# Patient Record
Sex: Female | Born: 1995 | Race: White | Hispanic: No | Marital: Married | Smoking: Never smoker
Health system: Southern US, Community
[De-identification: ages and names within clinical notes are randomized; demographics above are authoritative.]

## PROBLEM LIST (undated history)

## (undated) ENCOUNTER — Inpatient Hospital Stay (HOSPITAL_COMMUNITY): Payer: Self-pay

## (undated) DIAGNOSIS — Z8679 Personal history of other diseases of the circulatory system: Secondary | ICD-10-CM

## (undated) HISTORY — PX: OTHER SURGICAL HISTORY: SHX169

---
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
## 2015-01-10 HISTORY — PX: OTHER SURGICAL HISTORY: SHX169

## 2015-01-10 HISTORY — PX: WISDOM TOOTH EXTRACTION: SHX21

## 2018-09-10 DIAGNOSIS — U071 COVID-19: Secondary | ICD-10-CM

## 2018-09-10 HISTORY — DX: COVID-19: U07.1

## 2019-06-20 ENCOUNTER — Telehealth: Payer: Self-pay

## 2019-06-20 NOTE — Telephone Encounter (Signed)
NOTES ON FILE FROM  THE [facility] [phone], SENT REFERRAL TO SCHEDULING

## 2019-07-03 ENCOUNTER — Other Ambulatory Visit: Payer: Self-pay | Admitting: *Deleted

## 2019-07-03 DIAGNOSIS — R000 Tachycardia, unspecified: Secondary | ICD-10-CM

## 2019-07-04 ENCOUNTER — Telehealth: Payer: Self-pay | Admitting: Radiology

## 2019-07-04 NOTE — Telephone Encounter (Signed)
Enrolled patient for a 14 day Zio monitor to be mailed to patients home.  

## 2019-07-09 ENCOUNTER — Telehealth: Payer: Self-pay | Admitting: *Deleted

## 2019-07-09 NOTE — Telephone Encounter (Signed)
Returned call to inform patient ok to hold off applying ZIO patch monitor until she returns home from beach trip.  Monitor not waterproof for swimming and may fall off with excessive perspiration.  No answer/ No voicemail.

## 2019-07-09 NOTE — Telephone Encounter (Signed)
New message   Patient wants to know if okay to delay wearing heart monitor. She is going to beach. Please advise.

## 2019-07-16 ENCOUNTER — Encounter: Payer: Self-pay | Admitting: Family Medicine

## 2019-07-18 ENCOUNTER — Other Ambulatory Visit (INDEPENDENT_AMBULATORY_CARE_PROVIDER_SITE_OTHER): Payer: BC Managed Care – PPO

## 2019-07-18 DIAGNOSIS — R000 Tachycardia, unspecified: Secondary | ICD-10-CM

## 2019-07-24 ENCOUNTER — Emergency Department (HOSPITAL_COMMUNITY): Payer: BC Managed Care – PPO

## 2019-07-24 ENCOUNTER — Emergency Department (HOSPITAL_COMMUNITY)
Admission: EM | Admit: 2019-07-24 | Discharge: 2019-07-25 | Disposition: A | Discharge status: Home / Self Care | Payer: BC Managed Care – PPO | Attending: Emergency Medicine | Admitting: Emergency Medicine

## 2019-07-24 ENCOUNTER — Encounter (HOSPITAL_COMMUNITY): Payer: Self-pay | Admitting: *Deleted

## 2019-07-24 ENCOUNTER — Telehealth: Payer: Self-pay | Admitting: Advanced Practice Midwife

## 2019-07-24 ENCOUNTER — Other Ambulatory Visit: Payer: Self-pay

## 2019-07-24 DIAGNOSIS — O26899 Other specified pregnancy related conditions, unspecified trimester: Secondary | ICD-10-CM | POA: Diagnosis not present

## 2019-07-24 DIAGNOSIS — N939 Abnormal uterine and vaginal bleeding, unspecified: Secondary | ICD-10-CM

## 2019-07-24 DIAGNOSIS — R102 Pelvic and perineal pain: Secondary | ICD-10-CM

## 2019-07-24 DIAGNOSIS — O26859 Spotting complicating pregnancy, unspecified trimester: Secondary | ICD-10-CM | POA: Insufficient documentation

## 2019-07-24 LAB — HCG, QUANTITATIVE, PREGNANCY: hCG, Beta Chain, Quant, S: <1 m[IU]/mL (ref <5)

## 2019-07-24 LAB — CBC WITH DIFFERENTIAL/PLATELET
Abs Immature Granulocytes: 0.03 10*3/uL (ref 0.00–0.07)
Basophils Absolute: 0 10*3/uL (ref 0.0–0.1)
Basophils Relative: 0 %
Eosinophils Absolute: 0.4 10*3/uL (ref 0.0–0.5)
Eosinophils Relative: 4 %
HCT: 39.9 % (ref 36.0–46.0)
Hemoglobin: 13 g/dL (ref 12.0–15.0)
Immature Granulocytes: 0 %
Lymphocytes Relative: 24 %
Lymphs Abs: 2.2 10*3/uL (ref 0.7–4.0)
MCH: 29 pg (ref 26.0–34.0)
MCHC: 32.6 g/dL (ref 30.0–36.0)
MCV: 89.1 fL (ref 80.0–100.0)
Monocytes Absolute: 0.6 10*3/uL (ref 0.1–1.0)
Monocytes Relative: 6 %
Neutro Abs: 6 10*3/uL (ref 1.7–7.7)
Neutrophils Relative %: 66 %
Platelets: 222 10*3/uL (ref 150–400)
RBC: 4.48 MIL/uL (ref 3.87–5.11)
RDW: 12.8 % (ref 11.5–15.5)
WBC: 9.3 10*3/uL (ref 4.0–10.5)
nRBC: 0 % (ref 0.0–0.2)

## 2019-07-24 LAB — URINALYSIS, ROUTINE W REFLEX MICROSCOPIC
Bilirubin Urine: NEGATIVE
Glucose, UA: NEGATIVE mg/dL
Ketones, ur: NEGATIVE mg/dL
Nitrite: NEGATIVE
Protein, ur: NEGATIVE mg/dL
RBC / HPF: >50 RBC/hpf — ABNORMAL HIGH (ref 0–5)
Specific Gravity, Urine: 1.01 (ref 1.005–1.030)
pH: 6 (ref 5.0–8.0)

## 2019-07-24 LAB — COMPREHENSIVE METABOLIC PANEL
ALT: 13 U/L (ref 0–44)
AST: 18 U/L (ref 15–41)
Albumin: 4 g/dL (ref 3.5–5.0)
Alkaline Phosphatase: 81 U/L (ref 38–126)
Anion gap: 9 (ref 5–15)
BUN: 9 mg/dL (ref 6–20)
CO2: 24 mmol/L (ref 22–32)
Calcium: 8.8 mg/dL — ABNORMAL LOW (ref 8.9–10.3)
Chloride: 104 mmol/L (ref 98–111)
Creatinine, Ser: 0.5 mg/dL (ref 0.44–1.00)
GFR calc Af Amer: >60 mL/min (ref >60)
GFR calc non Af Amer: >60 mL/min (ref >60)
Glucose, Bld: 94 mg/dL (ref 70–99)
Potassium: 3.5 mmol/L (ref 3.5–5.1)
Sodium: 137 mmol/L (ref 135–145)
Total Bilirubin: 0.7 mg/dL (ref 0.3–1.2)
Total Protein: 7.6 g/dL (ref 6.5–8.1)

## 2019-07-24 MED ORDER — ACETAMINOPHEN 325 MG PO TABS
650.0000 mg | ORAL_TABLET | Freq: Once | ORAL | Status: AC
  Administered 2019-07-24: 650 mg via ORAL
  Filled 2019-07-24: qty 2

## 2019-07-24 NOTE — Telephone Encounter (Signed)
This is the pt the new pt that I sent a message about. She said she is going to think about it . Is having severe cramping but the bleeding just started an hour ago , SHe just wanted to see if a nurse could call her back. I advised her that if she did decide to go to ER or anything got worse before we called her back to let go on to the ER and just call and let us know.

## 2019-07-24 NOTE — ED Triage Notes (Signed)
Had a positive pregnancy test  4 days ago, states she started bleeding today.states bleeding is heavier than regular period

## 2019-07-24 NOTE — Discharge Instructions (Addendum)
At this time there does not appear to be the presence of an emergent medical condition, however there is always the potential for conditions to change. Please read and follow the below instructions.  Please return to the Emergency Department immediately for any new or worsening symptoms. Please be sure to follow up with your Primary Care Provider within one week regarding your visit today; please call their office to schedule an appointment even if you are feeling better for a follow-up visit. Follow-up with your primary care provider and your OB/GYN for recheck.   Get help right away if: You pass out. You have to change pads every hour. You have belly (abdominal) pain. You have a fever. You get sweaty. You get weak. You passing large blood clots from your vagina. You have any new/concerning or worsening of symptoms  Please read the additional information packets attached to your discharge summary.  Do not take your medicine if  develop an itchy rash, swelling in your mouth or lips, or difficulty breathing; call 911 and seek immediate emergency medical attention if this occurs.  You may review your lab tests and imaging results in their entirety on your MyChart account.  Please discuss all results of fully with your primary care provider and other specialist at your follow-up visit.  Note: Portions of this text may have been transcribed using voice recognition software. Every effort was made to ensure accuracy; however, inadvertent computerized transcription errors may still be present.

## 2019-07-24 NOTE — ED Notes (Signed)
LMP June 13 th.  Pt reports having 3 positive pregnancy on Sunday.  Started today with abdominal cramping and vaginal bleeding with blood clots.  Rates pain 3/10.

## 2019-07-24 NOTE — ED Provider Notes (Signed)
[facility] EMERGENCY DEPARTMENT Provider Note   CSN: [id number] Arrival date & time: 07/24/19  1503     History Chief Complaint  Patient presents with  . Vaginal Bleeding    Brooke Sharp is a 24 y.o. female otherwise healthy noted medication use presents today for abdominal pain and vaginal bleeding.  Patient reports that she has been feeling well, she took 3 pregnancy tests on Sunday all of which showed positive.  She reports that today around 1:30 PM she began having low abdominal cramping sensation that she describes as constant nonradiating worse in the left lower quadrant, no clear aggravating or alleviating factors, no pain medication prior to arrival.  Associated with nausea without vomiting.  She reports that earlier time her cramping sensation started she began having vaginal bleeding as well.  She reports her last menstrual period was June 22, 2019.  Denies fever/chills, chest pain/shortness of breath, vomiting, diarrhea, dysuria/hematuria, concern for STI, vaginal discharge or any additional concerns.  Estimated gestational age [redacted] weeks and 5 days.  HPI     History reviewed. No pertinent past medical history.  There are no problems to display for this patient.   History reviewed. No pertinent surgical history.   OB History    Gravida  1   Para      Term      Preterm      AB      Living        SAB      TAB      Ectopic      Multiple      Live Births              No family history on file.  Social History   Tobacco Use  . Smoking status: Never Smoker  . Smokeless tobacco: Never Used  Substance Use Topics  . Alcohol use: Yes  . Drug use: Not Currently    Home Medications Prior to Admission medications   Not on File    Allergies    Meloxicam and Shellfish allergy  Review of Systems   Review of Systems Ten systems are reviewed and are negative for acute change except as noted in the HPI  Physical Exam Updated Vital Signs BP  128/77   Pulse 63   Temp 98.4 F (36.9 C)   Resp 18   Ht 5\' 4"  (1.626 m)   Wt 97.5 kg   LMP 06/21/2019   SpO2 99%   BMI 36.90 kg/m   Physical Exam Constitutional:      General: She is not in acute distress.    Appearance: Normal appearance. She is well-developed. She is not ill-appearing or diaphoretic.  HENT:     Head: Normocephalic and atraumatic.  Eyes:     General: Vision grossly intact. Gaze aligned appropriately.     Pupils: Pupils are equal, round, and reactive to light.  Neck:     Trachea: Trachea and phonation normal.  Pulmonary:     Effort: Pulmonary effort is normal. No respiratory distress.  Abdominal:     General: There is no distension.     Palpations: Abdomen is soft.     Tenderness: There is abdominal tenderness in the suprapubic area and left lower quadrant. There is no guarding or rebound.  Genitourinary:    Comments: Pelvic examination deferred by patient. Musculoskeletal:        General: Normal range of motion.     Cervical back: Normal range of motion.  Skin:    General: Skin is warm and dry.  Neurological:     Mental Status: She is alert.     GCS: GCS eye subscore is 4. GCS verbal subscore is 5. GCS motor subscore is 6.     Comments: Speech is clear and goal oriented, follows commands Major Cranial nerves without deficit, no facial droop Moves extremities without ataxia, coordination intact  Psychiatric:        Behavior: Behavior normal.     ED Results / Procedures / Treatments   Labs (all labs ordered are listed, but only abnormal results are displayed) Labs Reviewed  COMPREHENSIVE METABOLIC PANEL - Abnormal; Notable for the following components:      Result Value   Calcium 8.8 (*)    All other components within normal limits  URINALYSIS, ROUTINE W REFLEX MICROSCOPIC - Abnormal; Notable for the following components:   APPearance HAZY (*)    Hgb urine dipstick LARGE (*)    Leukocytes,Ua SMALL (*)    RBC / HPF >50 (*)    Bacteria, UA  RARE (*)    All other components within normal limits  URINE CULTURE  CBC WITH DIFFERENTIAL/PLATELET  HCG, QUANTITATIVE, PREGNANCY    EKG None  Radiology US PELVIC COMPLETE W TRANSVAGINAL AND TORSION R/O  Result Date: 07/24/2019 CLINICAL DATA:  Left pelvic pain EXAM: TRANSABDOMINAL AND TRANSVAGINAL ULTRASOUND OF PELVIS DOPPLER ULTRASOUND OF OVARIES TECHNIQUE: Both transabdominal and transvaginal ultrasound examinations of the pelvis were performed. Transabdominal technique was performed for global imaging of the pelvis including uterus, ovaries, adnexal regions, and pelvic cul-de-sac. It was necessary to proceed with endovaginal exam following the transabdominal exam to visualize the ovaries bilaterally. Color and duplex Doppler ultrasound was utilized to evaluate blood flow to the ovaries. COMPARISON:  None. FINDINGS: Uterus Measurements: 6.6 x 3.0 x 4.2 cm = volume: 44 mL. No fibroids or other mass visualized. Endometrium Thickness: Uniform, 6 mm.  No focal abnormality visualized. Right ovary Measurements: 3.1 x 2.1 x 2.1 cm = volume: 7 mL. Normal appearance/no adnexal mass. Several follicles noted. Left ovary Measurements: 1.6 x 1.8 x 2.5 cm = volume: 4 mL. Normal appearance/no adnexal mass. Several follicles are noted. Pulsed Doppler evaluation of both ovaries demonstrates normal low-resistance arterial and venous waveforms. Other findings No abnormal free fluid. IMPRESSION: Normal examination. Electronically Signed   By: Helyn Numbers MD   On: 07/24/2019 23:42    Procedures Procedures (including critical care time)  Medications Ordered in ED Medications  acetaminophen (TYLENOL) tablet 650 mg (650 mg Oral Given 07/24/19 2011)    ED Course  I have reviewed the triage vital signs and the nursing notes.  Pertinent labs & imaging results that were available during my care of the patient were reviewed by me and considered in my medical decision making (see chart for details).    MDM  Rules/Calculators/A&P                          Additional History Obtained: 1. Nursing notes from this visit.  24 year old female presents today for concern of lower abdominal pain and vaginal bleeding started today.  She reports that she had 3+ home pregnancy test on Sunday.  Last menstrual period was June 22, 2019, estimated gestational age of [redacted] weeks and 5 days.  Reports nausea is associated symptom.  No fever/chills, chest pain/shortness of breath, vomiting, diarrhea, dysuria/hematuria, concern for STI or any additional concerns.  Will obtain basic labs including  CBC, CMP, urinalysis, quantitative hCG.  Will obtain pelvic ultrasound to look for ectopic. - I ordered, reviewed and interpreted labs which include: CBC shows no leukocytosis to suggest infection and no evidence of anemia. Urinalysis shows leukocytes, WBCs, RBCs and bacteria, patient is menstruating suspect contaminated catch, urine culture sent no negation antibiotics at this point. CMP shows no emergent electrolyte derangement, evidence of acute kidney injury, elevation of LFTs or gap. Quantitative hCG is negative.  Given patient's amount of pain concern for possible torsion, ultrasound order changed to torsion rule out.  Discussed case with Dr. Deretha Emory who agrees. --------------- Pelvic US w/ torsion r/o:  IMPRESSION:  Normal examination.   Given home pregnancy tests were positive but a hCG quantitative here in the hospital being less than 1 unclear if this is a heavy menstrual cycle for the patient versus early miscarriage.  We will have patient follow-up with her PCP and OB/GYN.  On reexamination patient is well-appearing in no acute distress states understanding of care plan and is agreeable for discharge.  Of note patient deferred pelvic examination today, she denies any concern for STI, low suspicion for PID at this time, patient will follow up with outpatient providers and return to the ER for any new or worsening  symptoms.  Patient states understanding that evaluation for STI PID cannot be performed without pelvic examination.  Initially on reexamination abdomen is nontender, I doubt appendicitis, diverticulitis, SBO, perforation or other acute intra-abdominal/pelvic pathologies at this time.  At this time there does not appear to be any evidence of an acute emergency medical condition and the patient appears stable for discharge with appropriate outpatient follow up. Diagnosis was discussed with patient who verbalizes understanding of care plan and is agreeable to discharge. I have discussed return precautions with patient and husband who verbalizes understanding. Patient encouraged to follow-up with their PCP and OBGYN. All questions answered.  Patient's case discussed with Dr. Deretha Emory who agrees with plan to discharge with follow-up.   Note: Portions of this report may have been transcribed using voice recognition software. Every effort was made to ensure accuracy; however, inadvertent computerized transcription errors may still be present. Final Clinical Impression(s) / ED Diagnoses Final diagnoses:  Vaginal bleeding    Rx / DC Orders ED Discharge Orders    None       Elizabeth Palau 07/24/19 2354    Vanetta Mulders, MD 07/25/19 [phone]

## 2019-07-26 LAB — URINE CULTURE

## 2019-07-27 ENCOUNTER — Telehealth: Payer: Self-pay | Admitting: Emergency Medicine

## 2019-07-27 NOTE — Telephone Encounter (Signed)
Post ED Visit - Positive Culture Follow-up  Culture report reviewed by antimicrobial stewardship pharmacist: [facility] []  , Pharm.D. []  Enzo Bi, Pharm.D., BCPS AQ-ID []  , Pharm.D., BCPS []  Celedonio Miyamoto, Pharm.D., BCPS []  [place], Garvin Fila.D., BCPS, AAHIVP []  , Pharm.D., BCPS, AAHIVP []  Georgina Pillion, PharmD, BCPS []  , PharmD, BCPS []  [place], PharmD, BCPS [x]  [address], PharmD []  , PharmD, BCPS []  Estella Husk, PharmD  Pharmacy Team []  Lysle Pearl, PharmD []  , PharmD []  Phillips Climes, PharmD []  , Rph []  Agapito Games) , PharmD []  Joaquim Lai, PharmD []  , PharmD []  Mervyn Gay, PharmD []  , PharmD []  Vinnie Level, PharmD []  [facility], PharmD []  , PharmD []  Len Childs, PharmD   Positive urine culture No further patient follow-up is required at this time.  Maricel Swartzendruber 07/27/2019, 5:28 PM

## 2019-07-30 ENCOUNTER — Encounter: Payer: Self-pay | Admitting: Family Medicine

## 2019-09-12 ENCOUNTER — Other Ambulatory Visit: Payer: BC Managed Care – PPO | Admitting: Adult Health

## 2019-11-07 NOTE — Progress Notes (Deleted)
   Office Visit Note  Patient: Brooke Sharp             Date of Birth: [birth year]           MRN: [id number]             PCP: Tanna Furry, MD Referring: Tanna Furry,* Visit Date: 11/21/2019 Occupation: @GUAROCC @  Subjective:  No chief complaint on file.   History of Present Illness: Brooke Sharp is a 24 y.o. female ***   Activities of Daily Living:  Patient reports morning stiffness for *** {minute/hour:19697}.   Patient {ACTIONS;DENIES/REPORTS:21021675::"Denies"} nocturnal pain.  Difficulty dressing/grooming: {ACTIONS;DENIES/REPORTS:21021675::"Denies"} Difficulty climbing stairs: {ACTIONS;DENIES/REPORTS:21021675::"Denies"} Difficulty getting out of chair: {ACTIONS;DENIES/REPORTS:21021675::"Denies"} Difficulty using hands for taps, buttons, cutlery, and/or writing: {ACTIONS;DENIES/REPORTS:21021675::"Denies"}  No Rheumatology ROS completed.   PMFS History:  There are no problems to display for this patient.   No past medical history on file.  No family history on file. No past surgical history on file. Social History   Social History Narrative  . Not on file    There is no immunization history on file for this patient.   Objective: Vital Signs: LMP 06/21/2019    Physical Exam   Musculoskeletal Exam: ***  CDAI Exam: CDAI Score: -- Patient Global: --; Provider Global: -- Swollen: --; Tender: -- Joint Exam 11/21/2019   No joint exam has been documented for this visit   There is currently no information documented on the homunculus. Go to the Rheumatology activity and complete the homunculus joint exam.  Investigation: No additional findings.  Imaging: No results found.  Recent Labs: Lab Results  Component Value Date   WBC 9.3 07/24/2019   HGB 13.0 07/24/2019   PLT 222 07/24/2019   NA 137 07/24/2019   K 3.5 07/24/2019   CL 104 07/24/2019   CO2 24 07/24/2019   GLUCOSE 94 07/24/2019   BUN 9 07/24/2019   CREATININE 0.50  07/24/2019   BILITOT 0.7 07/24/2019   ALKPHOS 81 07/24/2019   AST 18 07/24/2019   ALT 13 07/24/2019   PROT 7.6 07/24/2019   ALBUMIN 4.0 07/24/2019   CALCIUM 8.8 (L) 07/24/2019   GFRAA >60 07/24/2019    Speciality Comments: No specialty comments available.  Procedures:  No procedures performed Allergies: Meloxicam and Shellfish allergy   Assessment / Plan:     Visit Diagnoses: Polyarthralgia - MCP/PIP and both ankle joints, morning stiffness 2hr+, actively trying to get pregnant  Family history of rheumatoid arthritis  History of esophageal reflux  Orders: No orders of the defined types were placed in this encounter.  No orders of the defined types were placed in this encounter.   Face-to-face time spent with patient was *** minutes. Greater than 50% of time was spent in counseling and coordination of care.  Follow-Up Instructions: No follow-ups on file.   07/26/2019, PA-C  Note - This record has been created using Dragon software.  Chart creation errors have been sought, but may not always  have been located. Such creation errors do not reflect on  the standard of medical care.

## 2019-11-21 ENCOUNTER — Ambulatory Visit: Payer: BC Managed Care – PPO | Admitting: Rheumatology

## 2019-11-21 DIAGNOSIS — Z8261 Family history of arthritis: Secondary | ICD-10-CM

## 2019-11-21 DIAGNOSIS — Z8719 Personal history of other diseases of the digestive system: Secondary | ICD-10-CM

## 2019-11-21 DIAGNOSIS — M2550 Pain in unspecified joint: Secondary | ICD-10-CM

## 2019-12-26 ENCOUNTER — Ambulatory Visit: Payer: BC Managed Care – PPO | Admitting: Rheumatology

## 2020-02-24 ENCOUNTER — Encounter (HOSPITAL_COMMUNITY): Payer: Self-pay | Admitting: Psychiatry

## 2020-02-24 ENCOUNTER — Other Ambulatory Visit: Payer: Self-pay

## 2020-02-24 ENCOUNTER — Ambulatory Visit (INDEPENDENT_AMBULATORY_CARE_PROVIDER_SITE_OTHER): Payer: Self-pay | Admitting: Psychiatry

## 2020-02-24 DIAGNOSIS — F4323 Adjustment disorder with mixed anxiety and depressed mood: Secondary | ICD-10-CM

## 2020-02-25 ENCOUNTER — Encounter (HOSPITAL_COMMUNITY): Payer: Self-pay | Admitting: Psychiatry

## 2020-02-25 DIAGNOSIS — F4323 Adjustment disorder with mixed anxiety and depressed mood: Secondary | ICD-10-CM | POA: Insufficient documentation

## 2020-02-25 HISTORY — DX: Adjustment disorder with mixed anxiety and depressed mood: F43.23
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
## 2020-06-24 ENCOUNTER — Other Ambulatory Visit: Payer: Self-pay

## 2020-06-24 ENCOUNTER — Encounter (HOSPITAL_BASED_OUTPATIENT_CLINIC_OR_DEPARTMENT_OTHER): Payer: Self-pay | Admitting: Obstetrics & Gynecology

## 2020-06-24 DIAGNOSIS — Z973 Presence of spectacles and contact lenses: Secondary | ICD-10-CM

## 2020-06-24 HISTORY — DX: Presence of spectacles and contact lenses: Z97.3

## 2020-06-24 NOTE — Progress Notes (Signed)
Spoke w/ via phone for pre-op interview---pt Lab needs dos----  none per anesthesia, surgery orders pending             Lab results------none COVID test -----patient states asymptomatic no test needed Arrive at ------- 530 am 06-29-2020  NO Solid Food.  Clear liquids from MN until---430 am then npo Med rec completed Medications to take morning of surgery -----none Diabetic medication -----n/a Patient instructed no nail polish to be worn day of surgery Patient instructed to bring photo id and insurance card day of surgery Patient aware to have Driver (ride ) / caregiver    spouse justin will stay for 24 hours after surgery  Patient Special Instructions -----none Pre-Op special Istructions -----none Patient verbalized understanding of instructions that were given at this phone interview. Patient denies shortness of breath, chest pain, fever, cough at this phone interview.

## 2020-06-25 NOTE — H&P (Signed)
Brooke Sharp is an 25 y.o. female G3P0030 with missed abortion diagnosed on 6/15 measuring [redacted]w[redacted]d.  Patient declined expectant and medical management.  She presents today for Suction D&E.    Pertinent Gynecological History: Menses: flow is moderate Bleeding: n/a Contraception: none DES exposure: unknown Blood transfusions: none Sexually transmitted diseases: no past history Previous GYN Procedures:  none   Last mammogram:  n/a  Date: n/a Last pap:  n/a  Date: n/a OB History: G3, P0   Menstrual History: Menarche age: n/a Patient's last menstrual period was 04/23/2020.    Past Medical History:  Diagnosis Date   Adjustment disorder with mixed anxiety and depressed mood 02/25/2020   COVID 09/2018   weakness, sob muscle cramps x 2 weeks all symptoms resolved   History of palpitations in adulthood    summer 2021, wore heart monitor from pcp dr Gabriel Rung [facility], issue resolved   Wears glasses 06/24/2020    Past Surgical History:  Procedure Laterality Date   left shoulder surgery Left 2017   labral tear   WISDOM TOOTH EXTRACTION  2017    Family History  Problem Relation Age of Onset   Depression Mother    Depression Paternal Grandmother    Drug abuse Paternal Grandmother     Social History:  reports that she has never smoked. She has never used smokeless tobacco. She reports current alcohol use. She reports previous drug use.  Allergies:  Allergies  Allergen Reactions   Shellfish Allergy Anaphylaxis   Latex Hives   Meloxicam Swelling    No medications prior to admission.    Review of Systems  Height 5\' 4"  (1.626 m), weight 96.2 kg, last menstrual period 04/23/2020, unknown if currently breastfeeding. Physical Exam Constitutional:      Appearance: Normal appearance.  HENT:     Head: Normocephalic and atraumatic.  Pulmonary:     Effort: Pulmonary effort is normal.  Abdominal:     Palpations: Abdomen is soft.  Musculoskeletal:         General: Normal range of motion.     Cervical back: Normal range of motion.  Skin:    General: Skin is warm and dry.  Neurological:     Mental Status: She is alert and oriented to person, place, and time.  Psychiatric:        Mood and Affect: Mood normal.        Behavior: Behavior normal.    No results found for this or any previous visit (from the past 24 hour(s)).  No results found.  Assessment/Plan: Missed abortion at 6 weeks Blood type pending; rhogam if RH negative. Plan Suction D&C.  Patient has been counseled re: risk of bleeding, infection, scarring and damage to surrounding structures.  She is informed of steps of the procedure itself as well as postop expectations.  Patient is offered genetic studies and is undecided.  Will readdress of day of surgery.  04/25/2020 06/25/2020, 8:21 AM

## 2020-06-29 ENCOUNTER — Ambulatory Visit (HOSPITAL_BASED_OUTPATIENT_CLINIC_OR_DEPARTMENT_OTHER): Payer: BC Managed Care – PPO | Admitting: Certified Registered"

## 2020-06-29 ENCOUNTER — Other Ambulatory Visit: Payer: Self-pay

## 2020-06-29 ENCOUNTER — Encounter (HOSPITAL_BASED_OUTPATIENT_CLINIC_OR_DEPARTMENT_OTHER)
Admission: RE | Disposition: A | Discharge status: Home / Self Care | Payer: Self-pay | Source: Home / Self Care | Attending: Obstetrics & Gynecology

## 2020-06-29 ENCOUNTER — Ambulatory Visit (HOSPITAL_COMMUNITY): Payer: BC Managed Care – PPO

## 2020-06-29 ENCOUNTER — Ambulatory Visit (HOSPITAL_BASED_OUTPATIENT_CLINIC_OR_DEPARTMENT_OTHER)
Admission: RE | Admit: 2020-06-29 | Discharge: 2020-06-29 | Disposition: A | Discharge status: Home / Self Care | Payer: BC Managed Care – PPO | Attending: Obstetrics & Gynecology | Admitting: Obstetrics & Gynecology

## 2020-06-29 ENCOUNTER — Encounter (HOSPITAL_BASED_OUTPATIENT_CLINIC_OR_DEPARTMENT_OTHER): Payer: Self-pay | Admitting: Obstetrics & Gynecology

## 2020-06-29 DIAGNOSIS — Z91040 Latex allergy status: Secondary | ICD-10-CM | POA: Insufficient documentation

## 2020-06-29 DIAGNOSIS — Z886 Allergy status to analgesic agent status: Secondary | ICD-10-CM | POA: Insufficient documentation

## 2020-06-29 DIAGNOSIS — Z91013 Allergy to seafood: Secondary | ICD-10-CM | POA: Diagnosis not present

## 2020-06-29 DIAGNOSIS — Z8616 Personal history of COVID-19: Secondary | ICD-10-CM | POA: Diagnosis not present

## 2020-06-29 DIAGNOSIS — O021 Missed abortion: Secondary | ICD-10-CM | POA: Diagnosis not present

## 2020-06-29 DIAGNOSIS — O039 Complete or unspecified spontaneous abortion without complication: Secondary | ICD-10-CM

## 2020-06-29 HISTORY — DX: Personal history of other diseases of the circulatory system: Z86.79

## 2020-06-29 LAB — CBC
HCT: 44.3 % (ref 36.0–46.0)
Hemoglobin: 14.6 g/dL (ref 12.0–15.0)
MCH: 29.1 pg (ref 26.0–34.0)
MCHC: 33 g/dL (ref 30.0–36.0)
MCV: 88.4 fL (ref 80.0–100.0)
Platelets: 245 10*3/uL (ref 150–400)
RBC: 5.01 MIL/uL (ref 3.87–5.11)
RDW: 12.6 % (ref 11.5–15.5)
WBC: 8.8 10*3/uL (ref 4.0–10.5)
nRBC: 0 % (ref 0.0–0.2)

## 2020-06-29 LAB — TYPE AND SCREEN
ABO/RH(D): A POS
Antibody Screen: NEGATIVE

## 2020-06-29 LAB — ABO/RH: ABO/RH(D): A POS

## 2020-06-29 SURGERY — DILATION AND EVACUATION, UTERUS
Anesthesia: Monitor Anesthesia Care

## 2020-06-29 MED ORDER — FENTANYL CITRATE (PF) 100 MCG/2ML IJ SOLN: 25.0000 ug | INTRAMUSCULAR | Status: DC | PRN

## 2020-06-29 MED ORDER — MISOPROSTOL 200 MCG PO TABS
ORAL_TABLET | ORAL | Status: AC
  Filled 2020-06-29: qty 4

## 2020-06-29 MED ORDER — PROMETHAZINE HCL 25 MG/ML IJ SOLN: 6.2500 mg | INTRAMUSCULAR | Status: DC | PRN

## 2020-06-29 MED ORDER — LIDOCAINE HCL 1 % IJ SOLN
INTRAMUSCULAR | Status: AC
  Filled 2020-06-29: qty 20

## 2020-06-29 MED ORDER — ONDANSETRON HCL 4 MG/2ML IJ SOLN
INTRAMUSCULAR | Status: AC
  Filled 2020-06-29: qty 2

## 2020-06-29 MED ORDER — PROPOFOL 10 MG/ML IV BOLUS
INTRAVENOUS | Status: AC
  Filled 2020-06-29: qty 40

## 2020-06-29 MED ORDER — LIDOCAINE HCL (PF) 2 % IJ SOLN
INTRAMUSCULAR | Status: AC
  Filled 2020-06-29: qty 5

## 2020-06-29 MED ORDER — LACTATED RINGERS IV SOLN: INTRAVENOUS | Status: DC

## 2020-06-29 MED ORDER — SODIUM CHLORIDE 0.9 % IV SOLN
100.0000 mg | Freq: Once | INTRAVENOUS | Status: DC
  Filled 2020-06-29: qty 100

## 2020-06-29 MED ORDER — POVIDONE-IODINE 10 % EX SWAB: 2.0000 "application " | Freq: Once | CUTANEOUS | Status: DC

## 2020-06-29 MED ORDER — ACETAMINOPHEN 500 MG PO TABS
ORAL_TABLET | ORAL | Status: AC
  Filled 2020-06-29: qty 2

## 2020-06-29 MED ORDER — DEXMEDETOMIDINE (PRECEDEX) IN NS 20 MCG/5ML (4 MCG/ML) IV SYRINGE
PREFILLED_SYRINGE | INTRAVENOUS | Status: AC
  Filled 2020-06-29: qty 5

## 2020-06-29 MED ORDER — SILVER NITRATE-POT NITRATE 75-25 % EX MISC
CUTANEOUS | Status: AC
  Filled 2020-06-29: qty 10

## 2020-06-29 MED ORDER — DEXAMETHASONE SODIUM PHOSPHATE 10 MG/ML IJ SOLN
INTRAMUSCULAR | Status: AC
  Filled 2020-06-29: qty 1

## 2020-06-29 MED ORDER — METHYLERGONOVINE MALEATE 0.2 MG/ML IJ SOLN
INTRAMUSCULAR | Status: AC
  Filled 2020-06-29: qty 1

## 2020-06-29 MED ORDER — FENTANYL CITRATE (PF) 100 MCG/2ML IJ SOLN
INTRAMUSCULAR | Status: AC
  Filled 2020-06-29: qty 2

## 2020-06-29 MED ORDER — ACETAMINOPHEN 500 MG PO TABS
1000.0000 mg | ORAL_TABLET | Freq: Once | ORAL | Status: AC
  Administered 2020-06-29: 1000 mg via ORAL

## 2020-06-29 MED ORDER — MIDAZOLAM HCL 2 MG/2ML IJ SOLN
INTRAMUSCULAR | Status: AC
  Filled 2020-06-29: qty 2

## 2020-06-29 SURGICAL SUPPLY — 16 items
CATH ROBINSON RED A/P 16FR (CATHETERS) ×1 IMPLANT
COVER WAND RF STERILE (DRAPES) ×1 IMPLANT
FILTER UTR ASPR ASSEMBLY (MISCELLANEOUS) ×1 IMPLANT
GLOVE SURG POLYISO LF SZ5.5 (GLOVE) ×2 IMPLANT
GLOVE SURG UNDER POLY LF SZ6 (GLOVE) ×1 IMPLANT
HOSE CONNECTING 18IN BERKELEY (TUBING) ×1 IMPLANT
KIT BERKELEY 1ST TRIMESTER 3/8 (MISCELLANEOUS) ×2 IMPLANT
KIT TURNOVER CYSTO (KITS) ×1 IMPLANT
PACK VAGINAL MINOR WOMEN LF (CUSTOM PROCEDURE TRAY) ×1 IMPLANT
SET BERKELEY SUCTION TUBING (SUCTIONS) ×1 IMPLANT
TOWEL OR 17X26 10 PK STRL BLUE (TOWEL DISPOSABLE) ×2 IMPLANT
VACURETTE 6 ASPIR F TIP BERK (CANNULA) IMPLANT
VACURETTE 7MM CVD STRL WRAP (CANNULA) IMPLANT
VACURETTE 8 RIGID CVD (CANNULA) IMPLANT
VACURETTE 9 RIGID CVD (CANNULA) IMPLANT
WATER STERILE IRR 500ML POUR (IV SOLUTION) ×1 IMPLANT

## 2020-06-29 NOTE — Progress Notes (Signed)
U/S shows no retained POC.  Will discharge patient.  Mitchel Honour, DO

## 2020-06-29 NOTE — Progress Notes (Signed)
Patient reports heavy bleeding and passing tissue yesterday.  Will do u/s to evaluate need for D&C.    Mitchel Honour, DO

## 2020-06-29 NOTE — Progress Notes (Signed)
IV removed after speaking to Dr. Langston Masker. Heart and grief information shared.  Dressed and ready to go home.

## 2020-06-29 NOTE — Anesthesia Preprocedure Evaluation (Deleted)
Anesthesia Evaluation  Patient identified by MRN, date of birth, ID band Patient awake    Reviewed: Allergy & Precautions, NPO status , Patient's Chart, lab work & pertinent test results  Airway Mallampati: II  TM Distance: >3 FB Neck ROM: Full    Dental  (+) Teeth Intact, Dental Advisory Given   Pulmonary neg pulmonary ROS,    Pulmonary exam normal breath sounds clear to auscultation       Cardiovascular negative cardio ROS Normal cardiovascular exam Rhythm:Regular Rate:Normal     Neuro/Psych PSYCHIATRIC DISORDERS Anxiety negative neurological ROS     GI/Hepatic negative GI ROS, Neg liver ROS,   Endo/Other  Obesity   Renal/GU negative Renal ROS     Musculoskeletal negative musculoskeletal ROS (+)   Abdominal   Peds  Hematology negative hematology ROS (+)   Anesthesia Other Findings Day of surgery medications reviewed with the patient.  Reproductive/Obstetrics Missed abortion                              Anesthesia Physical Anesthesia Plan  ASA: 2  Anesthesia Plan: MAC   Post-op Pain Management:    Induction: Intravenous  PONV Risk Score and Plan: 3 and Midazolam, TIVA, Propofol infusion and Ondansetron  Airway Management Planned: Nasal Cannula and Natural Airway  Additional Equipment:   Intra-op Plan:   Post-operative Plan:   Informed Consent: I have reviewed the patients History and Physical, chart, labs and discussed the procedure including the risks, benefits and alternatives for the proposed anesthesia with the patient or authorized representative who has indicated his/her understanding and acceptance.     Dental advisory given  Plan Discussed with: CRNA and Anesthesiologist  Anesthesia Plan Comments:         Anesthesia Quick Evaluation

## 2021-10-27 ENCOUNTER — Ambulatory Visit: Payer: Self-pay

## 2021-10-27 ENCOUNTER — Other Ambulatory Visit: Payer: Self-pay | Admitting: Nurse Practitioner

## 2021-10-27 DIAGNOSIS — M79644 Pain in right finger(s): Secondary | ICD-10-CM

## 2021-11-19 ENCOUNTER — Other Ambulatory Visit: Payer: Self-pay

## 2021-11-19 ENCOUNTER — Emergency Department (HOSPITAL_COMMUNITY): Payer: 59

## 2021-11-19 ENCOUNTER — Encounter (HOSPITAL_COMMUNITY): Payer: Self-pay | Admitting: Emergency Medicine

## 2021-11-19 ENCOUNTER — Emergency Department (HOSPITAL_COMMUNITY)
Admission: EM | Admit: 2021-11-19 | Discharge: 2021-11-19 | Disposition: A | Discharge status: Home / Self Care | Payer: 59 | Attending: Emergency Medicine | Admitting: Emergency Medicine

## 2021-11-19 DIAGNOSIS — H538 Other visual disturbances: Secondary | ICD-10-CM | POA: Insufficient documentation

## 2021-11-19 DIAGNOSIS — Z91040 Latex allergy status: Secondary | ICD-10-CM | POA: Insufficient documentation

## 2021-11-19 DIAGNOSIS — R7401 Elevation of levels of liver transaminase levels: Secondary | ICD-10-CM | POA: Insufficient documentation

## 2021-11-19 DIAGNOSIS — R059 Cough, unspecified: Secondary | ICD-10-CM | POA: Insufficient documentation

## 2021-11-19 DIAGNOSIS — R519 Headache, unspecified: Secondary | ICD-10-CM | POA: Insufficient documentation

## 2021-11-19 DIAGNOSIS — R200 Anesthesia of skin: Secondary | ICD-10-CM | POA: Insufficient documentation

## 2021-11-19 LAB — I-STAT CHEM 8, ED
BUN: 16 mg/dL (ref 6–20)
Calcium, Ion: 1.16 mmol/L (ref 1.15–1.40)
Chloride: 104 mmol/L (ref 98–111)
Creatinine, Ser: 0.6 mg/dL (ref 0.44–1.00)
Glucose, Bld: 106 mg/dL — ABNORMAL HIGH (ref 70–99)
HCT: 42 % (ref 36.0–46.0)
Hemoglobin: 14.3 g/dL (ref 12.0–15.0)
Potassium: 4.3 mmol/L (ref 3.5–5.1)
Sodium: 140 mmol/L (ref 135–145)
TCO2: 27 mmol/L (ref 22–32)

## 2021-11-19 LAB — CBC
HCT: 41.8 % (ref 36.0–46.0)
Hemoglobin: 13.7 g/dL (ref 12.0–15.0)
MCH: 29.3 pg (ref 26.0–34.0)
MCHC: 32.8 g/dL (ref 30.0–36.0)
MCV: 89.3 fL (ref 80.0–100.0)
Platelets: 246 10*3/uL (ref 150–400)
RBC: 4.68 MIL/uL (ref 3.87–5.11)
RDW: 12.4 % (ref 11.5–15.5)
WBC: 6.2 10*3/uL (ref 4.0–10.5)
nRBC: 0 % (ref 0.0–0.2)

## 2021-11-19 LAB — COMPREHENSIVE METABOLIC PANEL
ALT: 11 U/L (ref 0–44)
AST: 19 U/L (ref 15–41)
Albumin: 3.7 g/dL (ref 3.5–5.0)
Alkaline Phosphatase: 87 U/L (ref 38–126)
Anion gap: 11 (ref 5–15)
BUN: 12 mg/dL (ref 6–20)
CO2: 25 mmol/L (ref 22–32)
Calcium: 8.9 mg/dL (ref 8.9–10.3)
Chloride: 104 mmol/L (ref 98–111)
Creatinine, Ser: 0.69 mg/dL (ref 0.44–1.00)
GFR, Estimated: >60 mL/min (ref >60)
Glucose, Bld: 112 mg/dL — ABNORMAL HIGH (ref 70–99)
Potassium: 3.8 mmol/L (ref 3.5–5.1)
Sodium: 140 mmol/L (ref 135–145)
Total Bilirubin: 0.5 mg/dL (ref 0.3–1.2)
Total Protein: 6.9 g/dL (ref 6.5–8.1)

## 2021-11-19 LAB — I-STAT BETA HCG BLOOD, ED (MC, WL, AP ONLY): I-stat hCG, quantitative: <5 m[IU]/mL (ref <5)

## 2021-11-19 LAB — DIFFERENTIAL
Abs Immature Granulocytes: 0.02 10*3/uL (ref 0.00–0.07)
Basophils Absolute: 0.1 10*3/uL (ref 0.0–0.1)
Basophils Relative: 1 %
Eosinophils Absolute: 0.4 10*3/uL (ref 0.0–0.5)
Eosinophils Relative: 7 %
Immature Granulocytes: 0 %
Lymphocytes Relative: 31 %
Lymphs Abs: 1.9 10*3/uL (ref 0.7–4.0)
Monocytes Absolute: 0.3 10*3/uL (ref 0.1–1.0)
Monocytes Relative: 5 %
Neutro Abs: 3.4 10*3/uL (ref 1.7–7.7)
Neutrophils Relative %: 56 %

## 2021-11-19 LAB — ETHANOL: Alcohol, Ethyl (B): <10 mg/dL (ref <10)

## 2021-11-19 MED ORDER — SODIUM CHLORIDE 0.9 % IV SOLN: INTRAVENOUS | Status: DC

## 2021-11-19 MED ORDER — METOCLOPRAMIDE HCL 5 MG/ML IJ SOLN
10.0000 mg | Freq: Once | INTRAMUSCULAR | Status: AC
  Administered 2021-11-19: 10 mg via INTRAVENOUS
  Filled 2021-11-19: qty 2

## 2021-11-19 MED ORDER — ACETAMINOPHEN 500 MG PO TABS
1000.0000 mg | ORAL_TABLET | Freq: Once | ORAL | Status: AC
  Administered 2021-11-19: 1000 mg via ORAL
  Filled 2021-11-19: qty 2

## 2021-11-19 MED ORDER — IOHEXOL 350 MG/ML SOLN
75.0000 mL | Freq: Once | INTRAVENOUS | Status: AC | PRN
  Administered 2021-11-19: 75 mL via INTRAVENOUS

## 2021-11-19 MED ORDER — METOCLOPRAMIDE HCL 10 MG PO TABS: 10.0000 mg | ORAL_TABLET | Freq: Four times a day (QID) | ORAL | 0 refills | Status: DC

## 2021-11-19 MED ORDER — SODIUM CHLORIDE 0.9 % IV BOLUS
1000.0000 mL | Freq: Once | INTRAVENOUS | Status: AC
  Administered 2021-11-19: 1000 mL via INTRAVENOUS

## 2021-11-19 MED ORDER — DIPHENHYDRAMINE HCL 50 MG/ML IJ SOLN
12.5000 mg | Freq: Once | INTRAMUSCULAR | Status: AC
  Administered 2021-11-19: 12.5 mg via INTRAVENOUS
  Filled 2021-11-19: qty 1

## 2021-11-19 NOTE — ED Provider Notes (Signed)
[facility] EMERGENCY DEPARTMENT Provider Note   CSN: [id number] Arrival date & time: 11/19/21  8676     History  Chief Complaint  Patient presents with   Headache    Shenique Childers is a 26 y.o. female.   Headache    26 year old female with medical history significant for adjustment disorder with mixed anxiety and depressive mood, who presents to the emergency department with roughly 2 months of nearly constant daily headaches.  The patient states that she has noticed that it is been getting progressively worse.  She endorses a headache at the top of her head feeling like a pressure sensation.  It worsens with coughing.  She endorses intermittent blurred vision, denies any double vision.  She states that she is having numbness and tingling in her bilateral upper extremities from the elbows down.  She denies any weakness in her extremities.  She denies any facial droop.  She denies any fevers or chills.  She denies any neck rigidity.  She does endorse light sensitivity and sound sensitivity.  She endorses occasional nausea, no vomiting.  She states that she has been intermittently having her speech get jumbled and this has been worsening over the past few days.  Home Medications Prior to Admission medications   Medication Sig Start Date End Date Taking? Authorizing Provider  Prenatal Vit-Fe Fumarate-FA (MULTIVITAMIN-PRENATAL) 27-0.8 MG TABS tablet Take 1 tablet by mouth daily at 12 noon. With dhea    [provider]  VITAMIN D PO Take by mouth daily.    [provider]      Allergies    Shellfish allergy, Latex, and Meloxicam    Review of Systems   Review of Systems  Eyes:  Positive for visual disturbance.  Neurological:  Positive for headaches.  All other systems reviewed and are negative.   Physical Exam Updated Vital Signs BP 114/76   Pulse 79   Temp 98.4 F (36.9 C)   Resp 16   LMP 10/06/2021   SpO2 100%   Physical Exam Vitals and nursing note reviewed.  Constitutional:      General: She is not in acute distress.    Appearance: She is well-developed.  HENT:     Head: Normocephalic and atraumatic.  Eyes:     Conjunctiva/sclera: Conjunctivae normal.  Cardiovascular:     Rate and Rhythm: Normal rate and regular rhythm.  Pulmonary:     Effort: Pulmonary effort is normal. No respiratory distress.     Breath sounds: Normal breath sounds.  Abdominal:     Palpations: Abdomen is soft.     Tenderness: There is no abdominal tenderness.  Musculoskeletal:        General: No swelling.     Cervical back: Neck supple.  Skin:    General: Skin is warm and dry.     Capillary Refill: Capillary refill takes less than 2 seconds.  Neurological:     Mental Status: She is alert.     Comments: MENTAL STATUS EXAM:    Orientation: Alert and oriented to person, place and time.  Memory: Cooperative, follows commands well.  Language: Speech is clear and language is normal.   CRANIAL NERVES:    CN 2 (Optic): Visual fields intact to confrontation.  CN 3,4,6 (EOM): Pupils equal and reactive to light. Full extraocular eye movement without nystagmus.  CN 5 (Trigeminal): Facial sensation is normal, no weakness of masticatory muscles.  CN 7 (Facial): No facial weakness or asymmetry.  CN 8 (  Auditory): Auditory acuity grossly normal.  CN 9,10 (Glossophar): The uvula is midline, the palate elevates symmetrically.  CN 11 (spinal access): Normal sternocleidomastoid and trapezius strength.  CN 12 (Hypoglossal): The tongue is midline. No atrophy or fasciculations.Marland Kitchen   MOTOR:  Muscle Strength: 5/5RUE, 5/5LUE, 5/5RLE, 5/5LLE.   COORDINATION:   Intact finger-to-nose, no tremor, no pronator drift.   SENSATION:   Intact to light touch all four extremities.  GAIT: Gait normal without ataxia   Psychiatric:        Mood and Affect: Mood normal.     ED Results / Procedures / Treatments   Labs (all labs ordered are  listed, but only abnormal results are displayed) Labs Reviewed  COMPREHENSIVE METABOLIC PANEL - Abnormal; Notable for the following components:      Result Value   Glucose, Bld 112 (*)    All other components within normal limits  I-STAT CHEM 8, ED - Abnormal; Notable for the following components:   Glucose, Bld 106 (*)    All other components within normal limits  CBC  DIFFERENTIAL  ETHANOL  I-STAT BETA HCG BLOOD, ED ([facility], [facility], [facility] ONLY)    EKG EKG Interpretation  Date/Time:  Saturday November 19 2021 07:58:47 EST Ventricular Rate:  86 PR Interval:  138 QRS Duration: 80 QT Interval:  360 QTC Calculation: 430 R Axis:   55 Text Interpretation: Normal sinus rhythm Normal ECG No previous ECGs available Confirmed by Ernie Avena ([id number]) on 11/19/2021 2:24:06 PM  Radiology CT HEAD WO CONTRAST  Result Date: 11/19/2021 CLINICAL DATA:  26 year old female with 2 months of constant headaches, head pressure. Intermittent blurred vision and numbness. EXAM: CT HEAD WITHOUT CONTRAST TECHNIQUE: Contiguous axial images were obtained from the base of the skull through the vertex without intravenous contrast. RADIATION DOSE REDUCTION: This exam was performed according to the departmental dose-optimization program which includes automated exposure control, adjustment of the mA and/or kV according to patient size and/or use of iterative reconstruction technique. COMPARISON:  None Available. FINDINGS: Brain: Normal cerebral volume. No midline shift, ventriculomegaly, mass effect, evidence of mass lesion, intracranial hemorrhage or evidence of cortically based acute infarction. Gray-white matter differentiation is within normal limits throughout the brain. Vascular: No suspicious intracranial vascular hyperdensity. Skull: Negative. Sinuses/Orbits: Tympanic cavities and mastoids are clear. Visible paranasal sinuses are clear except for mild to moderate mucosal thickening in the left sphenoid. No complicating  features identified. Other: Visualized orbits and scalp soft tissues are within normal limits. IMPRESSION: 1. Normal noncontrast CT appearance of the brain. 2. Left sphenoid sinus inflammation, but no complicating features. Electronically Signed   By: Odessa Fleming M.D.   On: 11/19/2021 10:36    Procedures Procedures    Medications Ordered in ED Medications  sodium chloride 0.9 % bolus 1,000 mL (1,000 mLs Intravenous New Bag/Given 11/19/21 1343)    And  0.9 %  sodium chloride infusion (has no administration in time range)  metoCLOPramide (REGLAN) injection 10 mg (10 mg Intravenous Given 11/19/21 1352)  diphenhydrAMINE (BENADRYL) injection 12.5 mg (12.5 mg Intravenous Given 11/19/21 1348)  acetaminophen (TYLENOL) tablet 1,000 mg (1,000 mg Oral Given 11/19/21 1345)    ED Course/ Medical Decision Making/ A&P Clinical Course as of 11/19/21 1515  Sat Nov 19, 2021  1501 Assumed care from Dr Karene Fry. 26 yo F with 2 months of headache and non-specific symptoms. Non-contrast head CT WNL. Awaiting CTV at this time. Anticipate OP fu if CTV is WNL.  [RP]    Clinical Course User  Index [RP] Rondel BatonPaterson, Robert C, MD                           Medical Decision Making Amount and/or Complexity of Data Reviewed Labs: ordered. Radiology: ordered.  Risk OTC drugs. Prescription drug management.   26 year old female with medical history significant for adjustment disorder with mixed anxiety and depressive mood, who presents to the emergency department with roughly 2 months of nearly constant daily headaches.  The patient states that she has noticed that it is been getting progressively worse.  She endorses a headache at the top of her head feeling like a pressure sensation.  It worsens with coughing.  She endorses intermittent blurred vision, denies any double vision.  She states that she is having numbness and tingling in her bilateral upper extremities from the elbows down.  She denies any weakness in her  extremities.  She denies any facial droop.  She denies any fevers or chills.  She denies any neck rigidity.  She does endorse light sensitivity and sound sensitivity.  She endorses occasional nausea, no vomiting.  She states that she has been intermittently having her speech get jumbled and this has been worsening over the past few days.  Sherryl BartersHannah Elizabeth-Priddy Koffler is a 26 y.o. female who presents with two months of headache as per above. I have reviewed the nursing documentation for past medical history, family history, and social history. I have reviewed the EMR and have learned that she has no prior hx of migraines.  Currently, she is awake, alert, GCS 15, HDS, and afebrile. Her exam is most notable for normal gait, fully intact extraocular motions with bilaterally reactive pupils, no focal neurologic deficits, no meningismus, and no temporal tenderness. There is no rash. The headache was not sudden onset or the worst headache of the patient's life. There is no visual deficit.  I am most concerned for CVT vs chronic migraine headaches.  To further evaluate and risk stratify her, labs and imaging were obtained, which were significant for:  Labs: CMP unremarkable, hCG negative, ethanol negative, CBC without leukocytosis or anemia, platelets normal. Imaging:  CT Head: IMPRESSION:  1. Normal noncontrast CT appearance of the brain.  2. Left sphenoid sinus inflammation, but no complicating features.    Pt has no symptoms of bacterial sinusitis. Endorses some daily nasal congestion on awakening associated with seasonal allergies which clears rapidly. No fevers or chills, no purulent sinus drainage or sinus pain or pressure.   I do not think the patient has an aneurysm, intracranial bleed, mass lesion, meningitis, temporal arteritis, stroke, cluster headache, idiopathic intracranial hypertension, cavernous sinus thrombosis, carbon monoxide toxicity, herpes zoster, carotid or vertebral artery  dissection, or acute angle close glaucoma.  She denies a hx of thrombosis anywhere, and is not on estrogen containing OCPs, however, CVT remains on the differential therefore will obtain CT Venogram.  CT Venogram: Pending at time of signout.  On reassessment, the patient remained well appearing and was again able to ambulate without difficulty and did not have any focal neurologic deficits. Plan to follow-up CT, reassess the patient's symptoms, plan for likely DC if improved with outpatient neurology follow-up.    ED Medication Summary: Medications  sodium chloride 0.9 % bolus 1,000 mL (1,000 mLs Intravenous New Bag/Given 11/19/21 1343)    And  0.9 %  sodium chloride infusion (has no administration in time range)  metoCLOPramide (REGLAN) injection 10 mg (10 mg Intravenous Given 11/19/21 1352)  diphenhydrAMINE (BENADRYL) injection 12.5 mg (12.5 mg Intravenous Given 11/19/21 1348)  acetaminophen (TYLENOL) tablet 1,000 mg (1,000 mg Oral Given 11/19/21 1345)     Final Clinical Impression(s) / ED Diagnoses Final diagnoses:  Chronic nonintractable headache, unspecified headache type    Rx / DC Orders ED Discharge Orders     None         Ernie Avena, MD 11/19/21 1515

## 2021-11-19 NOTE — ED Provider Notes (Signed)
  Physical Exam  BP 111/61 (BP Location: Left Arm)   Pulse (!) 56   Temp 98.4 F (36.9 C)   Resp 14   LMP 10/06/2021   SpO2 99%   Physical Exam  Procedures  Procedures  ED Course / MDM   Clinical Course as of 11/19/21 1631  Sat Nov 19, 2021  1501 Assumed care from Dr Karene Fry. 26 yo F with 2 months of headache and non-specific symptoms. Non-contrast head CT WNL. Awaiting CTV at this time. Anticipate OP fu if CTV is WNL.  [RP]  1602 CT venogram did not reveal any acute abnormalities.  Patient reassessed and is feeling much better.  Says that her headache is minimal at this time.  We will have her follow-up with her primary doctor and neurology regarding her headache.  She was instructed to take Tylenol and ibuprofen along with Reglan as needed for headache.  Return precautions discussed prior to discharge. [RP]    Clinical Course User Index [RP] Rondel Baton, MD   Medical Decision Making Amount and/or Complexity of Data Reviewed Labs: ordered. Radiology: ordered.  Risk OTC drugs. Prescription drug management.     Rondel Baton, MD 11/19/21 [phone]

## 2021-11-19 NOTE — Discharge Instructions (Signed)
Today you were seen in the emergency department for your headache.    In the emergency department you had CT scans of your head that were reassuring.    At home, please take Tylenol and ibuprofen for headache along with the Reglan that we have prescribed you.    Check your MyChart online for the results of any tests that had not resulted by the time you left the emergency department.   Follow-up with your primary doctor in 2-3 days regarding your visit.  Follow-up with neurology as soon as possible about your headache.  Return immediately to the emergency department if you experience any of the following: Vision changes, slurred speech, facial droop, weakness of your arms or legs, or any other concerning symptoms.    Thank you for visiting our Emergency Department. It was a pleasure taking care of you today.

## 2021-11-19 NOTE — ED Notes (Signed)
Patient verbalizes understanding of discharge instructions. Opportunity for questioning and answers were provided. Armband removed by staff, pt discharged from ED.  

## 2021-11-19 NOTE — ED Triage Notes (Signed)
Patient here w/ constant headaches that have been happening for 2 months now, however this past week its gotten progressively worse. Patient describes the headache is occurring at the top of her head stating it feels mostly just like a pressure, when she coughs it does  get worse. She does report intermittent blurred vision, feeling as though her arms get numb and tingle, and feels that her words get jumbled intermittently and this has progressed over the last several days. Pt endorses nausea. Pt denies a hx of headaches/ migraines. Aox4.

## 2022-02-12 IMAGING — US US PELVIS COMPLETE TRANSABD/TRANSVAG W DUPLEX
1 series · 13 of 25 positions shown · non-contrast
Comparison: None.

CLINICAL DATA: Left pelvic pain

EXAM:
TRANSABDOMINAL AND TRANSVAGINAL ULTRASOUND OF PELVIS
DOPPLER ULTRASOUND OF OVARIES
TECHNIQUE: Both transabdominal and transvaginal ultrasound examinations of the
pelvis were performed. Transabdominal technique was performed for
global imaging of the pelvis including uterus, ovaries, adnexal
regions, and pelvic cul-de-sac.
It was necessary to proceed with endovaginal exam following the
transabdominal exam to visualize the ovaries bilaterally. Color and
duplex Doppler ultrasound was utilized to evaluate blood flow to the
ovaries.

[Series 1: us pelvis complete transabd/transvag w duplex · 0.22mm/px · 13 of 97 slices shown]
[im 1/97]
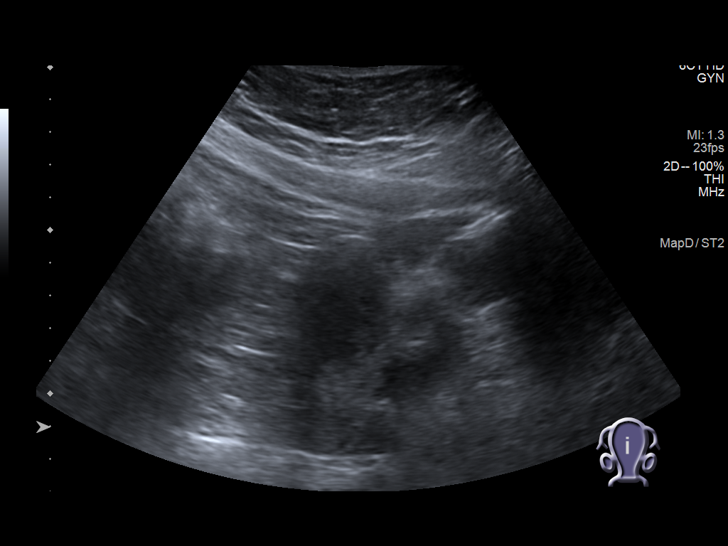
[im 9/97]
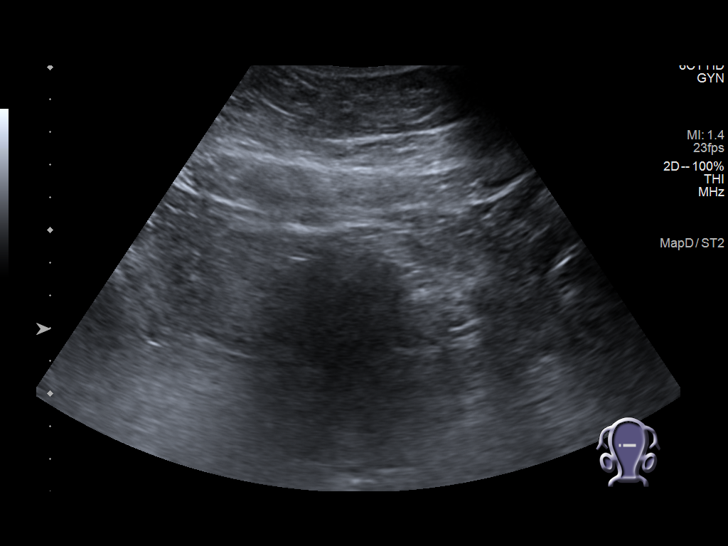
[im 17/97]
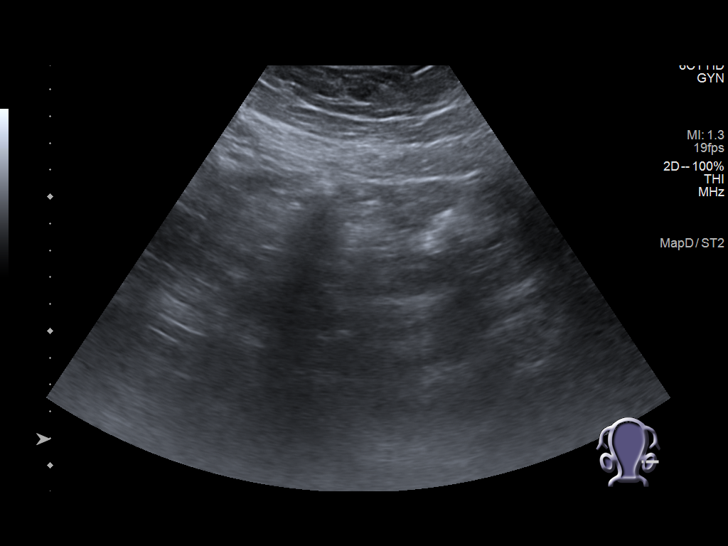
[im 25/97]
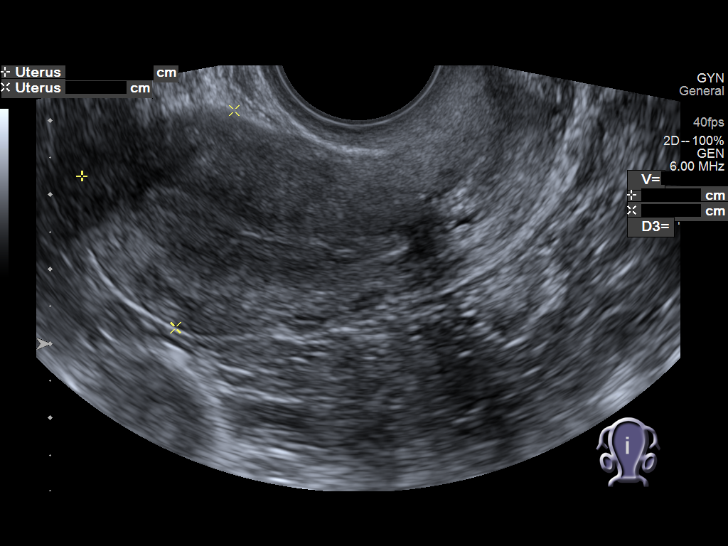
[im 33/97]
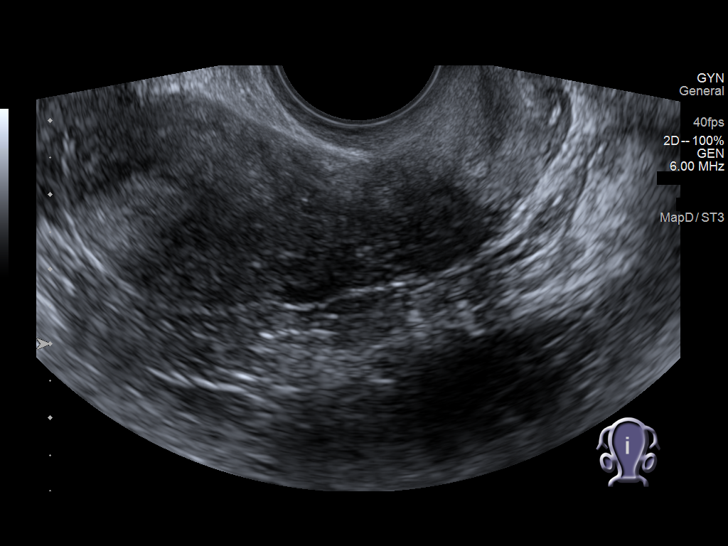
[im 41/97]
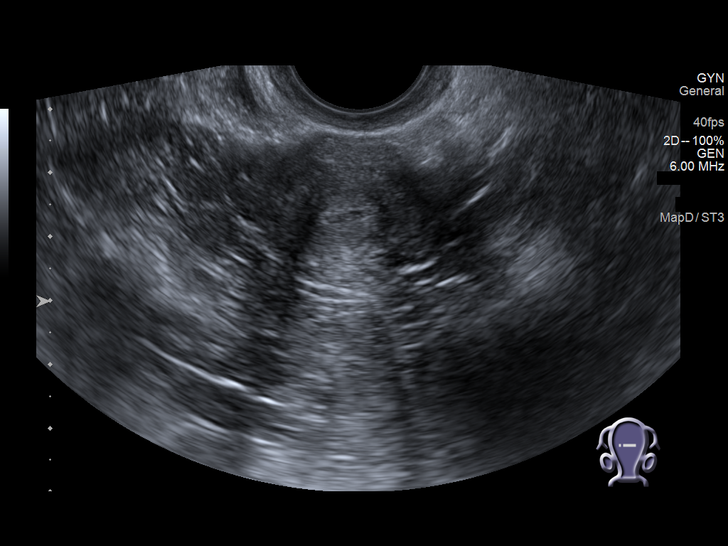
[im 49/97]
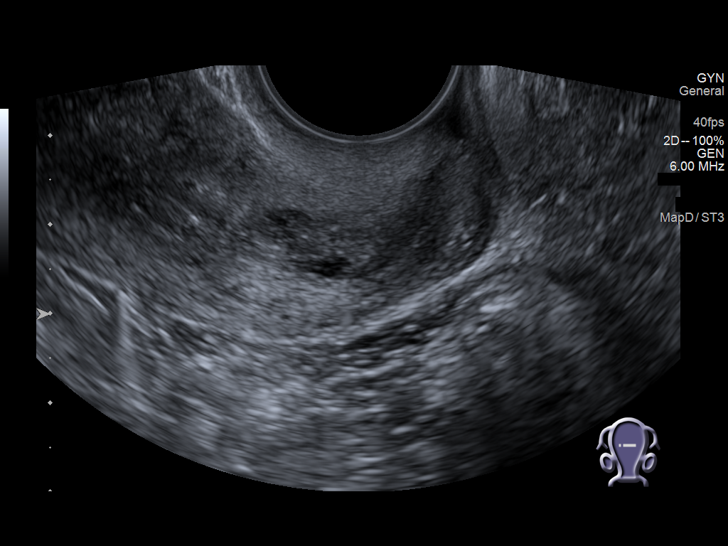
[im 57/97]
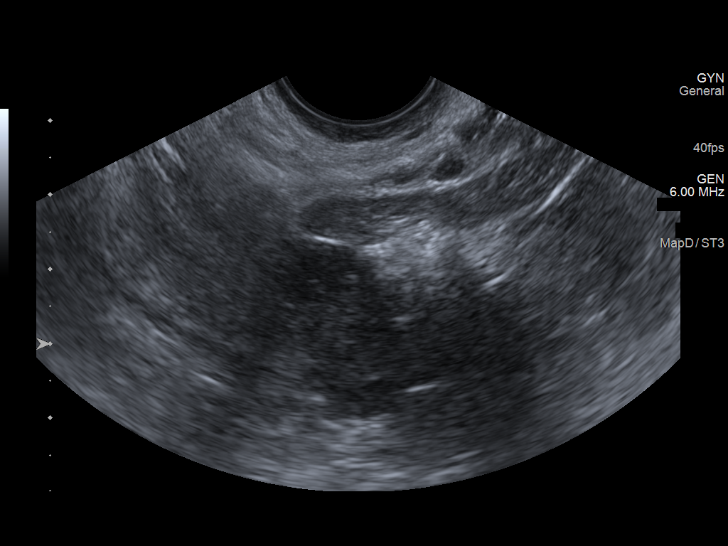
[im 65/97]
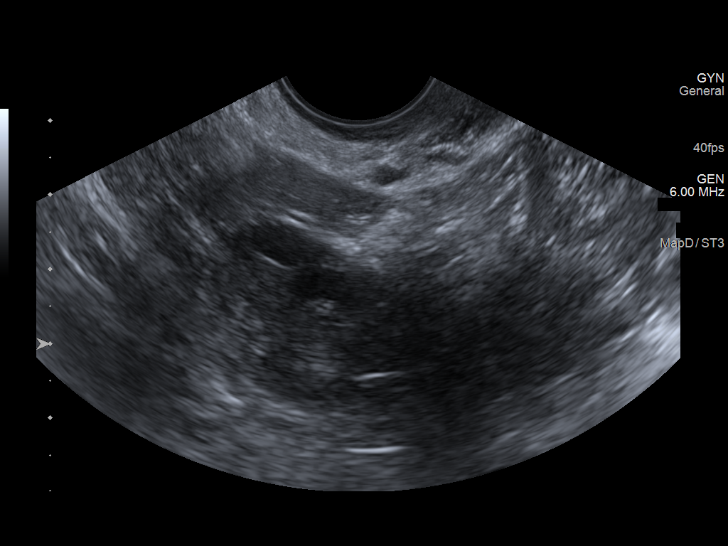
[im 73/97]
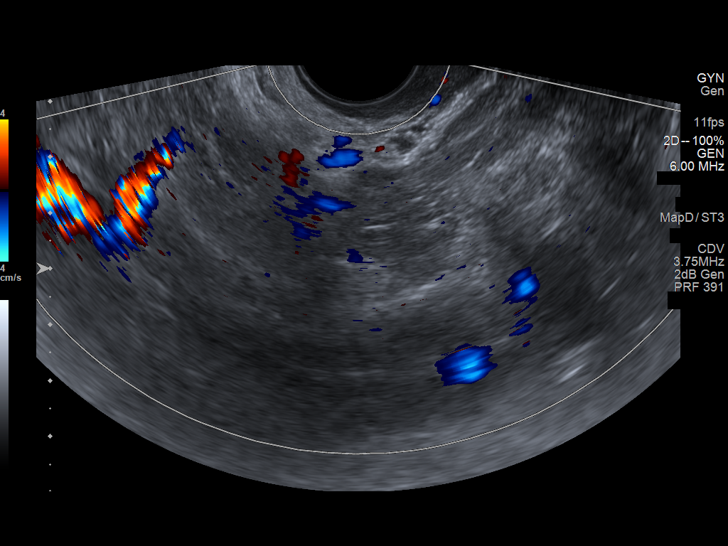
[im 81/97]
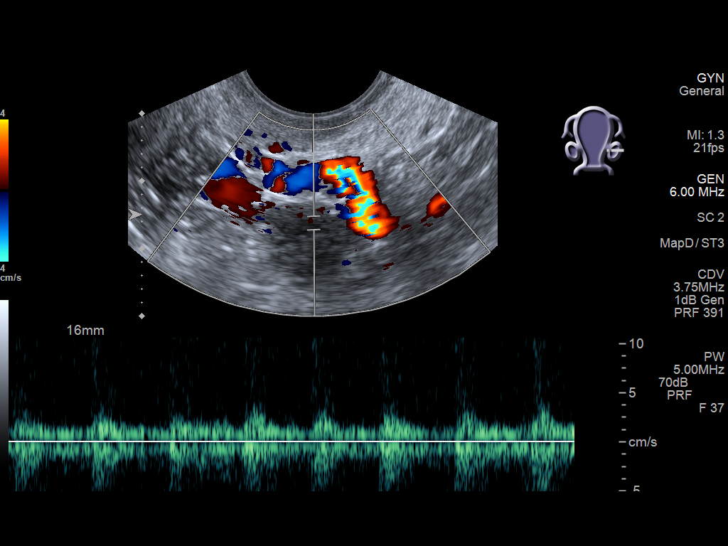
[im 89/97]
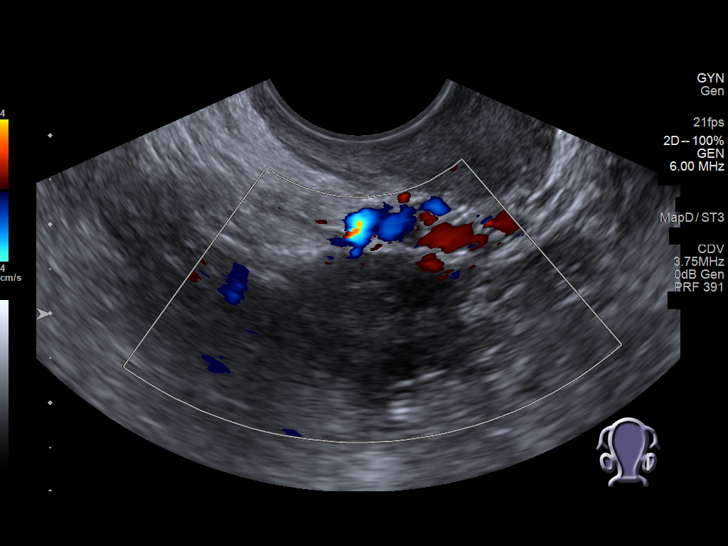
[im 97/97]
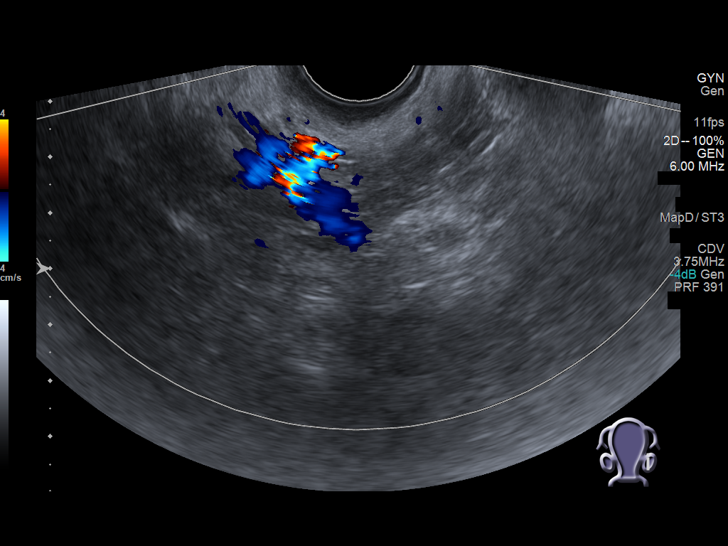

[13 of 25 positions shown; findings below may reference images not displayed]

FINDINGS: Uterus

Measurements: 6.6 x 3.0 x 4.2 cm = volume: 44 mL. No fibroids or
other mass visualized.

Endometrium

Thickness: Uniform, 6 mm.  No focal abnormality visualized.

Right ovary

Measurements: 3.1 x 2.1 x 2.1 cm = volume: 7 mL. Normal
appearance/no adnexal mass. Several follicles noted.

Left ovary

Measurements: 1.6 x 1.8 x 2.5 cm = volume: 4 mL. Normal
appearance/no adnexal mass. Several follicles are noted.

Pulsed Doppler evaluation of both ovaries demonstrates normal
low-resistance arterial and venous waveforms.

Other findings

No abnormal free fluid.
IMPRESSION: Normal examination.

## 2022-04-27 ENCOUNTER — Other Ambulatory Visit: Payer: Self-pay | Admitting: Family Medicine

## 2022-04-27 ENCOUNTER — Ambulatory Visit: Payer: Self-pay

## 2022-04-27 DIAGNOSIS — S93401A Sprain of unspecified ligament of right ankle, initial encounter: Secondary | ICD-10-CM

## 2022-05-17 DIAGNOSIS — N96 Recurrent pregnancy loss: Secondary | ICD-10-CM | POA: Diagnosis not present

## 2022-05-17 NOTE — Therapy (Signed)
OUTPATIENT PHYSICAL THERAPY LOWER EXTREMITY EVALUATION   Patient Name: Brooke Sharp MRN: [id number] DOB:Apr 21, 1995, 27 y.o., female Today's Date: 05/18/2022  END OF SESSION:  PT End of Session - 05/18/22 1348     Visit Number 1    Number of Visits 10    Date for PT Re-Evaluation 06/29/22    Authorization Type worker's comp, [place]    Authorization - Visit Number 1    Authorization - Number of Visits 8    PT Start Time 1400    PT Stop Time 1452    PT Time Calculation (min) 52 min    Activity Tolerance Patient tolerated treatment well;Patient limited by pain    Behavior During Therapy WFL for tasks assessed/performed             Past Medical History:  Diagnosis Date   Adjustment disorder with mixed anxiety and depressed mood 02/25/2020   COVID 09/2018   weakness, sob muscle cramps x 2 weeks all symptoms resolved   History of palpitations in adulthood    summer 2021, wore heart monitor from pcp dr Gabriel Rung [facility], issue resolved   Wears glasses 06/24/2020   Past Surgical History:  Procedure Laterality Date   left shoulder surgery Left 2017   labral tear   WISDOM TOOTH EXTRACTION  2017   Patient Active Problem List   Diagnosis Date Noted   Adjustment disorder with mixed anxiety and depressed mood 02/25/2020    PCP: No PCP in chart  REFERRING PROVIDER: Lanell Persons, MD  REFERRING DIAG: [phone] (ICD-10-CM) - Sprain of unspecified ligament of right ankle, subsequent encounter S93.601D (ICD-10-CM) - Unspecified sprain of right foot, subsequent encounter  THERAPY DIAG:  Pain in right ankle and joints of right foot  Stiffness of right ankle, not elsewhere classified  Other abnormalities of gait and mobility  Localized edema  Rationale for Evaluation and Treatment: Rehabilitation  ONSET DATE: End of March 2024  SUBJECTIVE:   SUBJECTIVE STATEMENT: Pt states in March she was carrying a linen bag that was fairly  heavy, tripped over it and rolled her ankle. States she initially thought pain would go away as she has rolled her ankles in the past, but it persisted into next day and so she went to see doctor. She states they gave her a surgical shoe, WBAT, was told that it was sprained. Encouraged to ice and rest. States she has had two follow up appts since. No longer wearing surgical shoe, now wearing ankle brace and remains WBAT. States lateral pain has gradually improved, but anterior and medial pain has improved "about 10%". States swelling has improved a great deal but does persist when she's up on her feet. Now icing PRN for swelling. No N/T. Tends to be pretty sore by end of work day  PERTINENT HISTORY: Anxiety/depression, palpitations with exercise  PAIN:  Are you having pain: 3/10 Location/description: lateral ankle, medial and anterior; variable pain tends to go from [employer] over past week: 3-6/10  - aggravating factors: driving, standing/walking >15 min, laundry (top loading, has to get on tip toes), uneven ground and hills  - Easing factors: rest, ice,     PRECAUTIONS: fall hx, palpitations with exercise (received work up)  WEIGHT BEARING RESTRICTIONS: No  FALLS:  Has patient fallen in last 6 months? Yes. 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  PATIENT SURVEYS:  FOTO 49 current, 64 predicted  COGNITION: Overall cognitive status: Within  functional limits for tasks assessed     SENSATION: Denies sensory complaints  EDEMA:  Mild swelling medial/anterior ankle around joint line, formal measurement deferred  PALPATION: Significant TTP anterior aspect of ankle, deltoid ligament, posterior to medial malleolus. Tenderness with palpation of distal gastroc/soleus with referral into medial ankle. No overt bony tenderness but is fairly tender circumferentially around medial malleolus   LOWER EXTREMITY ROM:  Active ROM Right eval Left eval  Knee flexion    Knee extension    Ankle dorsiflexion 8 deg mild pain * 12 deg  Ankle plantarflexion  45 deg  Ankle inversion 30 deg * 38 deg  Ankle eversion 12 deg* 15 deg   (Blank rows = not tested) Comments: concordant pain in anterior/dorsal foot with passive hallux extension, none with hallux flexion  LOWER EXTREMITY MMT:  MMT Right eval Left eval  Knee flexion    Knee extension    Ankle dorsiflexion  5  Ankle plantarflexion (modified sitting)  5  Ankle inversion  5  Ankle eversion  5   (Blank rows = not tested) Comments: RLE NT on eval given symptom irritability  FUNCTIONAL TESTS:  5xSTS: standard chair, no UE support  GAIT: Distance walked: within clinic  Assistive device utilized: no AD, ASO R LE Level of assistance: Complete Independence Comments: mild antalgic gait on RLE, reduced gait speed/cadence   TODAY'S TREATMENT:                                                                                                                              [facility] Adult PT Treatment:                                                DATE: 05/18/22 Therapeutic Exercise: Seated heel raise x10 RLE Toe scrunches x10 cues for tripod and reduced compensations HEP handout + education   PATIENT EDUCATION:  Education details: Pt education on PT impairments, prognosis, and POC. Informed consent. Rationale for interventions, safe/appropriate HEP performance Person educated:  Patient Education method: Explanation, Demonstration, Tactile cues, Verbal cues, and Handouts Education comprehension: verbalized understanding, returned demonstration, verbal cues required, tactile cues required, and needs further education    HOME EXERCISE PROGRAM: Access Code: [email or web address] URL: [email or web address] Date: 05/18/2022 Prepared by: Fransisco Hertz  Exercises - Seated Heel Raise  -  1 x daily - 7 x weekly - 2-3 sets - 10 reps - Towel Scrunches  - 1 x daily - 7 x weekly - 2-3 sets - 10 reps  ASSESSMENT:  CLINICAL IMPRESSION: Pt is a pleasant 27 year old woman who arrives to PT evaluation on this date for R ankle/foot pain after rolling her ankle at work in March 2023. Pt reports difficulty with prolonged standing, walking on even/uneven ground and inclines, and daily/work activities due to pain. During today's session pt demonstrates limitations in R ankle mobility in all planes (most concordant with inversion) which are limiting ability to perform aforementioned activities. MMT deferred given gradually increasing symptoms as exam goes on. Tolerates HEP well with education on appropriate home performance, no adverse events. Recommend skilled PT to address aforementioned deficits to improve functional independence/tolerance and progress back to work activities. Pt departs today's session in no acute distress, all voiced questions/concerns addressed appropriately from PT perspective.    OBJECTIVE IMPAIRMENTS: Abnormal gait, decreased activity tolerance, decreased balance, decreased endurance, decreased mobility, difficulty walking, decreased ROM, decreased strength, hypomobility, improper body mechanics, postural dysfunction, and pain.   ACTIVITY LIMITATIONS: carrying, lifting, standing, squatting, stairs, transfers, and locomotion level  PARTICIPATION LIMITATIONS: meal prep, cleaning, laundry, driving, community activity, and occupation   PERSONAL FACTORS: Time since  onset of injury/illness/exacerbation and 1 comorbidity: palpitations w/ exercise  are also affecting patient's functional outcome.   REHAB POTENTIAL: Good  CLINICAL DECISION MAKING: Evolving/moderate complexity  EVALUATION COMPLEXITY: Low   GOALS: Goals reviewed with patient? No  SHORT TERM GOALS: Target date: 06/08/2022 Pt will demonstrate appropriate understanding and performance of initially prescribed HEP in order to facilitate improved independence with management of symptoms.  Baseline: HEP provided on eval Goal status: INITIAL   2. Pt will score greater than or equal to 56 on FOTO in order to demonstrate improved perception of function due to symptoms.  Baseline: 49  Goal status: INITIAL    LONG TERM GOALS: Target date: 06/29/2022 Pt will score 64 or greater on FOTO in order to demonstrate improved perception of function due to symptoms.  Baseline: 49 Goal status: INITIAL  2.  Pt will demonstrate at least 12 degrees of ankle DF AROM in order to facilitate improved tolerance to functional movements such as squatting and walking up inclines. Baseline: see ROM chart above Goal status: INITIAL  3.  Pt will endorse ability to drive for up to 30 min with less than 2pt increase in pain on NPS in order to facilitate improved tolerance to community navigation.  Baseline: increased pain with minimal driving Goal status: INITIAL  4.  Pt will demonstrate ability to perform BIL heel raise and maintain for >15sec with less than 2 pt increase in pain in order to improve tolerance to laundry Baseline: top loading washer, pain/difficulty with loading due to heel raise per pt Goal status: INITIAL   5. Pt will demo/report ability to walk >45 min without seated rest, with less than 2pt increase on NPS in order to demo improved tolerance to recreational and work tasks.  Baseline: pain w/ >82min standing/walking  Goal status: INITIAL    PLAN:  PT FREQUENCY: 1-2x/week   PT DURATION: 6  weeks  PLANNED INTERVENTIONS: Therapeutic exercises, Therapeutic activity, Neuromuscular re-education, Balance training, Gait training, Patient/Family education, Self Care, Joint mobilization, Joint manipulation, Stair training, Aquatic Therapy, Dry Needling, Cryotherapy, Moist heat, Taping, Vasopneumatic device, Manual therapy, and Re-evaluation  PLAN FOR NEXT SESSION: review/update HEP PRN. Emphasis on foot intrinsic/extrinsic  training, comfortable ankle mobility, edema control    Ashley Murrain PT, DPT 05/18/2022 3:27 PM

## 2022-05-18 ENCOUNTER — Other Ambulatory Visit: Payer: Self-pay

## 2022-05-18 ENCOUNTER — Ambulatory Visit: Payer: PRIVATE HEALTH INSURANCE | Attending: Family Medicine | Admitting: Physical Therapy

## 2022-05-18 ENCOUNTER — Encounter: Payer: Self-pay | Admitting: Physical Therapy

## 2022-05-18 DIAGNOSIS — M25571 Pain in right ankle and joints of right foot: Secondary | ICD-10-CM | POA: Insufficient documentation

## 2022-05-18 DIAGNOSIS — R2689 Other abnormalities of gait and mobility: Secondary | ICD-10-CM | POA: Diagnosis present

## 2022-05-18 DIAGNOSIS — R600 Localized edema: Secondary | ICD-10-CM | POA: Diagnosis present

## 2022-05-18 DIAGNOSIS — M25671 Stiffness of right ankle, not elsewhere classified: Secondary | ICD-10-CM | POA: Insufficient documentation

## 2022-05-23 ENCOUNTER — Ambulatory Visit: Payer: PRIVATE HEALTH INSURANCE | Attending: Family Medicine | Admitting: Physical Therapy

## 2022-05-23 ENCOUNTER — Encounter: Payer: Self-pay | Admitting: Physical Therapy

## 2022-05-23 DIAGNOSIS — M25571 Pain in right ankle and joints of right foot: Secondary | ICD-10-CM | POA: Diagnosis present

## 2022-05-23 DIAGNOSIS — R2689 Other abnormalities of gait and mobility: Secondary | ICD-10-CM | POA: Insufficient documentation

## 2022-05-23 DIAGNOSIS — R600 Localized edema: Secondary | ICD-10-CM | POA: Diagnosis present

## 2022-05-23 DIAGNOSIS — M25671 Stiffness of right ankle, not elsewhere classified: Secondary | ICD-10-CM | POA: Insufficient documentation

## 2022-05-23 NOTE — Therapy (Signed)
OUTPATIENT PHYSICAL THERAPY TREATMENT NOTE    Patient Name: Brooke Sharp MRN: [id number] DOB:[birth year], 27 y.o., female Today's Date: 05/23/2022  END OF SESSION:  PT End of Session - 05/23/22 1452     Visit Number 2    Number of Visits 10    Date for PT Re-Evaluation 06/29/22    Authorization Type worker's comp, [place]    Authorization - Visit Number 2    Authorization - Number of Visits 8    PT Start Time 1453    PT Stop Time 1540    PT Time Calculation (min) 47 min    Activity Tolerance Patient tolerated treatment well;Patient limited by pain    Behavior During Therapy WFL for tasks assessed/performed              Past Medical History:  Diagnosis Date   Adjustment disorder with mixed anxiety and depressed mood 02/25/2020   COVID 09/2018   weakness, sob muscle cramps x 2 weeks all symptoms resolved   History of palpitations in adulthood    summer 2021, wore heart monitor from pcp dr Gabriel Rung [facility], issue resolved   Wears glasses 06/24/2020   Past Surgical History:  Procedure Laterality Date   left shoulder surgery Left 2017   labral tear   WISDOM TOOTH EXTRACTION  2017   Patient Active Problem List   Diagnosis Date Noted   Adjustment disorder with mixed anxiety and depressed mood 02/25/2020    PCP: No PCP in chart  REFERRING PROVIDER: Lanell Persons, MD  REFERRING DIAG: [phone] (ICD-10-CM) - Sprain of unspecified ligament of right ankle, subsequent encounter S93.601D (ICD-10-CM) - Unspecified sprain of right foot, subsequent encounter  THERAPY DIAG:  Pain in right ankle and joints of right foot  Stiffness of right ankle, not elsewhere classified  Other abnormalities of gait and mobility  Localized edema  Rationale for Evaluation and Treatment: Rehabilitation  ONSET DATE: End of March 2024  SUBJECTIVE:   SUBJECTIVE STATEMENT: 05/23/2022 Pt states she was a little sore initially after first session,  doing HEP twice a day. Still some soreness after HEP but only about 30 minutes and then returns to baseline. No other new updates.    Per eval - Pt states in March she was carrying a linen bag that was fairly heavy, tripped over it and rolled her ankle. States she initially thought pain would go away as she has rolled her ankles in the past, but it persisted into next day and so she went to see doctor. She states they gave her a surgical shoe, WBAT, was told that it was sprained. Encouraged to ice and rest. States she has had two follow up appts since. No longer wearing surgical shoe, now wearing ankle brace and remains WBAT. States lateral pain has gradually improved, but anterior and medial pain has improved "about 10%". States swelling has improved a great deal but does persist when she's up on her feet. Now icing PRN for swelling. No N/T. Tends to be pretty sore by end of work day  PERTINENT HISTORY: Anxiety/depression, palpitations with exercise  PAIN:  Are you having pain:  3/10 Location/description: lateral ankle, medial and anterior; variable pain tends to go from throbbing>stabbing   Per eval:  Best-worst over past week: 3-6/10  - aggravating factors: driving, standing/walking >15 min, laundry (top loading, has to get on tip toes), uneven ground and hills  - Easing factors: rest, ice,     PRECAUTIONS: fall hx, palpitations with  exercise (received work up)  WEIGHT BEARING RESTRICTIONS: No  FALLS:  Has patient fallen in last 6 months? Yes. 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  PATIENT SURVEYS:  FOTO 49 current, 64 predicted  COGNITION: Overall cognitive status: Within functional limits for tasks assessed     SENSATION: Denies sensory complaints  EDEMA:  Mild swelling medial/anterior ankle around joint line, formal measurement deferred  PALPATION: Significant TTP anterior aspect of ankle, deltoid ligament, posterior to medial malleolus. Tenderness with palpation of distal gastroc/soleus with referral into medial ankle. No overt bony tenderness but is fairly tender circumferentially around medial malleolus   LOWER EXTREMITY ROM:  Active ROM Right eval Left eval  Knee flexion    Knee extension    Ankle dorsiflexion 8 deg mild pain * 12 deg  Ankle plantarflexion  45 deg  Ankle inversion 30 deg * 38 deg  Ankle eversion 12 deg* 15 deg   (Blank rows = not tested) Comments: concordant pain in anterior/dorsal foot with passive hallux extension, none with hallux flexion  LOWER EXTREMITY MMT:  MMT Right eval Left eval  Knee flexion    Knee extension    Ankle dorsiflexion  5  Ankle plantarflexion (modified sitting)  5  Ankle inversion  5  Ankle eversion  5   (Blank rows = not tested) Comments: RLE NT on eval given symptom irritability  FUNCTIONAL TESTS:  5xSTS: standard chair, no UE support  GAIT: Distance walked: within clinic  Assistive device utilized: no AD, ASO R LE Level of assistance: Complete Independence Comments: mild antalgic gait on RLE, reduced gait speed/cadence   TODAY'S TREATMENT:                                                                                                                              [facility] Adult PT Treatment:                                                DATE: 05/23/22 Therapeutic Exercise: Toe scrunches 2x10 cues for  tripod Seated heel raise 2x10 RLE cues for setup AP rockerboard x8 cues for control  Inversion iso w ball 3x8 cues for foot positioning RTB eversion 2x8 cues for setup Manual Therapy: TC distraction  to pt tolerance, calcaneal distraction to pt tolerance STM to R anterior tib, gastroc soleus, post tib   [facility] Adult PT Treatment:                                                DATE: 05/18/22 Therapeutic Exercise: Seated heel raise x10 RLE Toe scrunches x10 cues for tripod and reduced compensations HEP handout + education   PATIENT EDUCATION:  Education details: rationale for interventions, relevant anatomy/physiology Person educated: Patient Education method: Explanation, Demonstration, Tactile cues, Verbal cues, and Handouts Education comprehension: verbalized understanding, returned demonstration, verbal cues required, tactile cues required, and needs further education    HOME EXERCISE PROGRAM: Access Code: [email or web address] URL: [email or web address] Date: 05/18/2022 Prepared by: Fransisco Hertz  Exercises - Seated Heel Raise  - 1 x daily - 7 x weekly - 2-3 sets - 10 reps - Towel Scrunches  - 1 x daily - 7 x weekly - 2-3 sets - 10 reps  ASSESSMENT:  CLINICAL IMPRESSION: 05/23/22 Pt arrives w/ 3/10 pain at present, reports good HEP adherence. Good performance w/ HEP review today, min cues. Able to tolerate progression for increased volume with foot intrinsic/extrinsic work although does endorse mild aggravation of symptoms as session goes on, peaking at 4.5/10 on NPS. Trial of manual post exercise with aim of symptom modification - concordant discomfort at R anterior tib and post tib that improves w/ STM, transient relief reported with TC distraction although pt notes no change in pain after manual. Departs with 4.5/10 pain, verbal HEP review. No adverse events. Pt departs today's session in no acute distress, all voiced questions/concerns addressed appropriately from PT  perspective.    Per eval - Pt is a pleasant 27 year old woman who arrives to PT evaluation on this date for R ankle/foot pain after rolling her ankle at work in March 2023. Pt reports difficulty with prolonged standing, walking on even/uneven ground and inclines, and daily/work activities due to pain. During today's session pt demonstrates limitations in R ankle mobility in all planes (most concordant with inversion) which are limiting ability to perform aforementioned activities. MMT deferred given gradually increasing symptoms as exam goes on. Tolerates HEP well with education on appropriate home performance, no adverse events. Recommend skilled PT to address aforementioned deficits to improve functional independence/tolerance and progress back to work activities. Pt departs today's session in no acute distress, all voiced questions/concerns addressed appropriately from PT perspective.    OBJECTIVE IMPAIRMENTS: Abnormal gait, decreased activity tolerance, decreased balance, decreased endurance, decreased mobility, difficulty walking, decreased ROM, decreased strength, hypomobility, improper body mechanics, postural dysfunction, and pain.   ACTIVITY LIMITATIONS: carrying, lifting, standing, squatting, stairs, transfers, and locomotion level  PARTICIPATION LIMITATIONS: meal prep, cleaning, laundry, driving, community activity, and occupation   PERSONAL FACTORS: Time since onset of injury/illness/exacerbation and 1 comorbidity: palpitations w/ exercise  are also affecting patient's functional outcome.   REHAB POTENTIAL: Good  CLINICAL DECISION MAKING: Evolving/moderate complexity  EVALUATION COMPLEXITY: Low   GOALS: Goals reviewed with patient? No  SHORT TERM GOALS: Target date: 06/08/2022 Pt will demonstrate appropriate understanding and performance of initially prescribed HEP in order to facilitate improved independence with management of symptoms.  Baseline: HEP provided on eval Goal  status: INITIAL   2. Pt will score greater than or equal to 56 on FOTO in order to demonstrate improved perception of function  due to symptoms.  Baseline: 49  Goal status: INITIAL    LONG TERM GOALS: Target date: 06/29/2022 Pt will score 64 or greater on FOTO in order to demonstrate improved perception of function due to symptoms.  Baseline: 49 Goal status: INITIAL  2.  Pt will demonstrate at least 12 degrees of ankle DF AROM in order to facilitate improved tolerance to functional movements such as squatting and walking up inclines. Baseline: see ROM chart above Goal status: INITIAL  3.  Pt will endorse ability to drive for up to 30 min with less than 2pt increase in pain on NPS in order to facilitate improved tolerance to community navigation.  Baseline: increased pain with minimal driving Goal status: INITIAL  4.  Pt will demonstrate ability to perform BIL heel raise and maintain for >15sec with less than 2 pt increase in pain in order to improve tolerance to laundry Baseline: top loading washer, pain/difficulty with loading due to heel raise per pt Goal status: INITIAL   5. Pt will demo/report ability to walk >45 min without seated rest, with less than 2pt increase on NPS in order to demo improved tolerance to recreational and work tasks.  Baseline: pain w/ >51min standing/walking  Goal status: INITIAL    PLAN:  PT FREQUENCY: 1-2x/week   PT DURATION: 6 weeks  PLANNED INTERVENTIONS: Therapeutic exercises, Therapeutic activity, Neuromuscular re-education, Balance training, Gait training, Patient/Family education, Self Care, Joint mobilization, Joint manipulation, Stair training, Aquatic Therapy, Dry Needling, Cryotherapy, Moist heat, Taping, Vasopneumatic device, Manual therapy, and Re-evaluation  PLAN FOR NEXT SESSION: review/update HEP PRN. Emphasis on foot intrinsic/extrinsic training, comfortable ankle mobility, edema control    Ashley Murrain PT, DPT 05/23/2022 5:18 PM

## 2022-05-24 DIAGNOSIS — Z3189 Encounter for other procreative management: Secondary | ICD-10-CM | POA: Diagnosis not present

## 2022-05-30 NOTE — Therapy (Signed)
OUTPATIENT PHYSICAL THERAPY TREATMENT NOTE    Patient Name: Brooke Sharp MRN: [id number] DOB:November 18, 1995, 27 y.o., female Today's Date: 05/31/2022  END OF SESSION:  PT End of Session - 05/31/22 1630     Visit Number 3    Number of Visits 10    Date for PT Re-Evaluation 06/29/22    Authorization Type worker's comp, [place]    Authorization - Visit Number 3    Authorization - Number of Visits 8    PT Start Time 1630    PT Stop Time 1717    PT Time Calculation (min) 47 min    Activity Tolerance Patient tolerated treatment well;Patient limited by pain    Behavior During Therapy WFL for tasks assessed/performed               Past Medical History:  Diagnosis Date   Adjustment disorder with mixed anxiety and depressed mood 02/25/2020   COVID 09/2018   weakness, sob muscle cramps x 2 weeks all symptoms resolved   History of palpitations in adulthood    summer 2021, wore heart monitor from pcp dr Gabriel Rung [facility], issue resolved   Wears glasses 06/24/2020   Past Surgical History:  Procedure Laterality Date   left shoulder surgery Left 2017   labral tear   WISDOM TOOTH EXTRACTION  2017   Patient Active Problem List   Diagnosis Date Noted   Adjustment disorder with mixed anxiety and depressed mood 02/25/2020    PCP: No PCP in chart  REFERRING PROVIDER: Lanell Persons, MD  REFERRING DIAG: [phone] (ICD-10-CM) - Sprain of unspecified ligament of right ankle, subsequent encounter S93.601D (ICD-10-CM) - Unspecified sprain of right foot, subsequent encounter  THERAPY DIAG:  Pain in right ankle and joints of right foot  Stiffness of right ankle, not elsewhere classified  Other abnormalities of gait and mobility  Localized edema  Rationale for Evaluation and Treatment: Rehabilitation  ONSET DATE: End of March 2024  SUBJECTIVE:   SUBJECTIVE STATEMENT: 05/31/2022 Pt at 2/10 pain at present. Felt pretty sore after last  session for about a day and then back to baseline, pt notes she was less sore than expected. Reports good adherence to HEP, feels exercises are getting less irritating.   Per eval - Pt states in March she was carrying a linen bag that was fairly heavy, tripped over it and rolled her ankle. States she initially thought pain would go away as she has rolled her ankles in the past, but it persisted into next day and so she went to see doctor. She states they gave her a surgical shoe, WBAT, was told that it was sprained. Encouraged to ice and rest. States she has had two follow up appts since. No longer wearing surgical shoe, now wearing ankle brace and remains WBAT. States lateral pain has gradually improved, but anterior and medial pain has improved "about 10%". States swelling has improved a great deal but does persist when she's up on her feet. Now icing PRN for swelling. No N/T. Tends to be pretty sore by end of work day  PERTINENT HISTORY: Anxiety/depression, palpitations with exercise  PAIN:  Are you having pain:  2/10 Location/description: lateral ankle, medial and anterior; variable pain tends to go from throbbing>stabbing   Per eval:  Best-worst over past week: 3-6/10  - aggravating factors: driving, standing/walking >15 min, laundry (top loading, has to get on tip toes), uneven ground and hills  - Easing factors: rest, ice,  PRECAUTIONS: fall hx, palpitations with exercise (received work up)  WEIGHT BEARING RESTRICTIONS: No  FALLS:  Has patient fallen in last 6 months? Yes. 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  PATIENT SURVEYS:  FOTO 49 current, 64 predicted  COGNITION: Overall cognitive status: Within functional limits for tasks assessed     SENSATION: Denies sensory complaints  EDEMA:  Mild swelling medial/anterior ankle around joint line, formal measurement deferred  PALPATION: Significant TTP anterior aspect of ankle, deltoid ligament, posterior to medial malleolus. Tenderness with palpation of distal gastroc/soleus with referral into medial ankle. No overt bony tenderness but is fairly tender circumferentially around medial malleolus   LOWER EXTREMITY ROM:  Active ROM Right eval Left eval  Knee flexion    Knee extension    Ankle dorsiflexion 8 deg mild pain * 12 deg  Ankle plantarflexion  45 deg  Ankle inversion 30 deg * 38 deg  Ankle eversion 12 deg* 15 deg   (Blank rows = not tested) Comments: concordant pain in anterior/dorsal foot with passive hallux extension, none with hallux flexion  LOWER EXTREMITY MMT:  MMT Right eval Left eval  Knee flexion    Knee extension    Ankle dorsiflexion  5  Ankle plantarflexion (modified sitting)  5  Ankle inversion  5  Ankle eversion  5   (Blank rows = not tested) Comments: RLE NT on eval given symptom irritability  FUNCTIONAL TESTS:  5xSTS: standard chair, no UE support  GAIT: Distance walked: within clinic  Assistive device utilized: no AD, ASO R LE Level of assistance: Complete Independence Comments: mild antalgic gait on RLE, reduced gait speed/cadence   TODAY'S TREATMENT:                                                                                                                              [facility] Adult PT Treatment:                                                DATE: 05/31/22 Therapeutic Exercise: Seated  heel/toe raises 2x8  Seated toe scrunches x12 NWB, 2x10 PWB with approx through thigh  Seated toe extension 2x12 seated cues for tripod Airex lateral weight shift RLE progressing into step ups x8  CGA for safety Inversion iso w ball 3x8 Seated gastroc stretch w strap 3x30sec Seated soleus stretch w strap 3x30sec Closed chain soleus stretch w and w/o MWM 1 each HEP update + education   [facility] Adult PT Treatment:                                                DATE: 05/23/22 Therapeutic Exercise: Toe scrunches 2x10 cues for tripod Seated heel raise 2x10 RLE cues for setup AP rockerboard x8 cues for control  Inversion iso w ball 3x8 cues for foot positioning RTB eversion 2x8 cues for setup Manual Therapy: TC distraction to pt tolerance, calcaneal distraction to pt tolerance STM to R anterior tib, gastroc soleus, post tib   [facility] Adult PT Treatment:                                                DATE: 05/18/22 Therapeutic Exercise: Seated heel raise x10 RLE Toe scrunches x10 cues for tripod and reduced compensations HEP handout + education   PATIENT EDUCATION:  Education details: rationale for interventions, relevant anatomy/physiology Person educated: Patient Education method: Explanation, Demonstration, Tactile cues, Verbal cues, and Handouts Education comprehension: verbalized understanding, returned demonstration, verbal cues required, tactile cues required, and needs further education    HOME EXERCISE PROGRAM: Access Code: [email or web address] URL: [email or web address] Date: 05/31/2022 Prepared by: Fransisco Hertz  Program Notes - put weight through thigh for increased weightbearing with towel scrunches   Exercises - Towel Scrunches  - 1 x daily - 7 x weekly - 2-3 sets - 10 reps - Seated Heel Toe Raises  - 1 x daily - 7 x weekly - 2-3 sets - 8 reps - Seated Great Toe Extension  - 1 x daily - 7 x weekly - 2-3 sets - 12 reps  ASSESSMENT:  CLINICAL IMPRESSION: 05/31/22 Pt  arrives w/ 2/10 pain, notes that HEP is getting less irritating. Had about a day of soreness after last session. Today progressing volume and increased WB with foot intrinsic/extrinsic and ankle work today, pt reports gradual increase in pain/soreness as session goes on. Notes that open chain ankle DF stretching elicits anterior joint line discomfort - trial of MWM which irritates symptoms further, pt notes resolution of anterior joint pain with closed chain soleus stretching (limited by stretching sensation posteriorly). Pt politely declines manual on this date with aim of assessing effect on post session symptoms. Departs with 5/10 pain on NPS, no adverse events, most difficult activity today is airex step ups. Recommend continuing along current POC in order to address relevant deficits and improve functional tolerance. Pt departs today's session in no acute distress, all voiced questions/concerns addressed appropriately from PT perspective.    Per eval - Pt is a pleasant 27 year old woman who arrives to PT evaluation on this date for R ankle/foot pain after rolling her ankle at work in March 2023. Pt reports difficulty with prolonged standing, walking on even/uneven ground and inclines, and daily/work activities due to pain. During today's session pt demonstrates limitations in R ankle mobility in all planes (most concordant with inversion) which are limiting ability to perform aforementioned activities. MMT deferred given gradually increasing symptoms as exam goes on. Tolerates HEP well with  education on appropriate home performance, no adverse events. Recommend skilled PT to address aforementioned deficits to improve functional independence/tolerance and progress back to work activities. Pt departs today's session in no acute distress, all voiced questions/concerns addressed appropriately from PT perspective.    OBJECTIVE IMPAIRMENTS: Abnormal gait, decreased activity tolerance, decreased balance, decreased  endurance, decreased mobility, difficulty walking, decreased ROM, decreased strength, hypomobility, improper body mechanics, postural dysfunction, and pain.   ACTIVITY LIMITATIONS: carrying, lifting, standing, squatting, stairs, transfers, and locomotion level  PARTICIPATION LIMITATIONS: meal prep, cleaning, laundry, driving, community activity, and occupation   PERSONAL FACTORS: Time since onset of injury/illness/exacerbation and 1 comorbidity: palpitations w/ exercise  are also affecting patient's functional outcome.   REHAB POTENTIAL: Good  CLINICAL DECISION MAKING: Evolving/moderate complexity  EVALUATION COMPLEXITY: Low   GOALS: Goals reviewed with patient? No  SHORT TERM GOALS: Target date: 06/08/2022 Pt will demonstrate appropriate understanding and performance of initially prescribed HEP in order to facilitate improved independence with management of symptoms.  Baseline: HEP provided on eval Goal status: INITIAL   2. Pt will score greater than or equal to 56 on FOTO in order to demonstrate improved perception of function due to symptoms.  Baseline: 49  Goal status: INITIAL    LONG TERM GOALS: Target date: 06/29/2022 Pt will score 64 or greater on FOTO in order to demonstrate improved perception of function due to symptoms.  Baseline: 49 Goal status: INITIAL  2.  Pt will demonstrate at least 12 degrees of ankle DF AROM in order to facilitate improved tolerance to functional movements such as squatting and walking up inclines. Baseline: see ROM chart above Goal status: INITIAL  3.  Pt will endorse ability to drive for up to 30 min with less than 2pt increase in pain on NPS in order to facilitate improved tolerance to community navigation.  Baseline: increased pain with minimal driving Goal status: INITIAL  4.  Pt will demonstrate ability to perform BIL heel raise and maintain for >15sec with less than 2 pt increase in pain in order to improve tolerance to  laundry Baseline: top loading washer, pain/difficulty with loading due to heel raise per pt Goal status: INITIAL   5. Pt will demo/report ability to walk >45 min without seated rest, with less than 2pt increase on NPS in order to demo improved tolerance to recreational and work tasks.  Baseline: pain w/ >1min standing/walking  Goal status: INITIAL    PLAN:  PT FREQUENCY: 1-2x/week   PT DURATION: 6 weeks  PLANNED INTERVENTIONS: Therapeutic exercises, Therapeutic activity, Neuromuscular re-education, Balance training, Gait training, Patient/Family education, Self Care, Joint mobilization, Joint manipulation, Stair training, Aquatic Therapy, Dry Needling, Cryotherapy, Moist heat, Taping, Vasopneumatic device, Manual therapy, and Re-evaluation  PLAN FOR NEXT SESSION: review/update HEP PRN. Emphasis on foot intrinsic/extrinsic training, comfortable ankle mobility, edema control    Ashley Murrain PT, DPT 05/31/2022 5:36 PM

## 2022-05-31 ENCOUNTER — Encounter: Payer: Self-pay | Admitting: Physical Therapy

## 2022-05-31 ENCOUNTER — Ambulatory Visit: Payer: PRIVATE HEALTH INSURANCE | Admitting: Physical Therapy

## 2022-05-31 DIAGNOSIS — M25571 Pain in right ankle and joints of right foot: Secondary | ICD-10-CM | POA: Diagnosis not present

## 2022-05-31 DIAGNOSIS — R2689 Other abnormalities of gait and mobility: Secondary | ICD-10-CM

## 2022-05-31 DIAGNOSIS — R600 Localized edema: Secondary | ICD-10-CM

## 2022-05-31 DIAGNOSIS — M25671 Stiffness of right ankle, not elsewhere classified: Secondary | ICD-10-CM

## 2022-06-03 DIAGNOSIS — M25571 Pain in right ankle and joints of right foot: Secondary | ICD-10-CM | POA: Diagnosis present

## 2022-06-03 DIAGNOSIS — M25671 Stiffness of right ankle, not elsewhere classified: Secondary | ICD-10-CM | POA: Diagnosis present

## 2022-06-03 DIAGNOSIS — R600 Localized edema: Secondary | ICD-10-CM | POA: Diagnosis present

## 2022-06-03 DIAGNOSIS — R2689 Other abnormalities of gait and mobility: Secondary | ICD-10-CM | POA: Diagnosis present

## 2022-06-07 NOTE — Therapy (Signed)
OUTPATIENT PHYSICAL THERAPY TREATMENT NOTE    Patient Name: Brooke Sharp MRN: [id number] DOB:09-Aug-1995, 27 y.o., female Today's Date: 06/08/2022  END OF SESSION:  PT End of Session - 06/08/22 1108     Visit Number 4    Number of Visits 10    Date for PT Re-Evaluation 06/29/22    Authorization Type worker's comp, [place]    Authorization - Visit Number 4    Authorization - Number of Visits 8    PT Start Time 1104    PT Stop Time 1143    PT Time Calculation (min) 39 min    Activity Tolerance Patient tolerated treatment well;Patient limited by pain                Past Medical History:  Diagnosis Date   Adjustment disorder with mixed anxiety and depressed mood 02/25/2020   COVID 09/2018   weakness, sob muscle cramps x 2 weeks all symptoms resolved   History of palpitations in adulthood    summer 2021, wore heart monitor from pcp dr Gabriel Rung [facility], issue resolved   Wears glasses 06/24/2020   Past Surgical History:  Procedure Laterality Date   left shoulder surgery Left 2017   labral tear   WISDOM TOOTH EXTRACTION  2017   Patient Active Problem List   Diagnosis Date Noted   Adjustment disorder with mixed anxiety and depressed mood 02/25/2020    PCP: No PCP in chart  REFERRING PROVIDER: Lanell Persons, MD  REFERRING DIAG: [phone] (ICD-10-CM) - Sprain of unspecified ligament of right ankle, subsequent encounter S93.601D (ICD-10-CM) - Unspecified sprain of right foot, subsequent encounter  THERAPY DIAG:  Pain in right ankle and joints of right foot  Stiffness of right ankle, not elsewhere classified  Other abnormalities of gait and mobility  Localized edema  Rationale for Evaluation and Treatment: Rehabilitation  ONSET DATE: End of March 2024  SUBJECTIVE:   SUBJECTIVE STATEMENT: 06/08/2022 Pt at 4/10 pain at present. Some days are worse than normal  I went to the beach and did extra walking then  Per eval -  Pt states in March she was carrying a linen bag that was fairly heavy, tripped over it and rolled her ankle. States she initially thought pain would go away as she has rolled her ankles in the past, but it persisted into next day and so she went to see doctor. She states they gave her a surgical shoe, WBAT, was told that it was sprained. Encouraged to ice and rest. States she has had two follow up appts since. No longer wearing surgical shoe, now wearing ankle brace and remains WBAT. States lateral pain has gradually improved, but anterior and medial pain has improved "about 10%". States swelling has improved a great deal but does persist when she's up on her feet. Now icing PRN for swelling. No N/T. Tends to be pretty sore by end of work day  PERTINENT HISTORY: Anxiety/depression, palpitations with exercise  PAIN:  Are you having pain:  2/10 Location/description: lateral ankle, medial and anterior; variable pain tends to go from throbbing>stabbing   Per eval:  Best-worst over past week: 3-6/10  - aggravating factors: driving, standing/walking >15 min, laundry (top loading, has to get on tip toes), uneven ground and hills  - Easing factors: rest, ice,     PRECAUTIONS: fall hx, palpitations with exercise (received work up)  WEIGHT BEARING RESTRICTIONS: No  FALLS:  Has patient fallen in last 6 months? Yes. 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  PATIENT SURVEYS:  FOTO 49 current, 64 predicted  COGNITION: Overall cognitive status: Within functional limits for tasks assessed     SENSATION: Denies sensory complaints  EDEMA:  Mild swelling medial/anterior ankle around joint line, formal measurement deferred  PALPATION: Significant TTP anterior aspect of ankle, deltoid ligament, posterior to medial malleolus. Tenderness with palpation of distal gastroc/soleus with referral into medial ankle. No overt bony tenderness but is fairly tender circumferentially around medial malleolus   LOWER EXTREMITY ROM:  Active ROM Right eval Left eval  Knee flexion    Knee extension    Ankle dorsiflexion 8 deg mild pain * 12 deg  Ankle plantarflexion  45 deg  Ankle inversion 30 deg * 38 deg  Ankle eversion 12 deg* 15 deg   (Blank rows = not tested) Comments: concordant pain in anterior/dorsal foot with passive hallux extension, none with hallux flexion  LOWER EXTREMITY MMT:  MMT Right eval Left eval  Knee flexion    Knee extension    Ankle dorsiflexion  5  Ankle plantarflexion (modified sitting)  5  Ankle inversion  5  Ankle eversion  5   (Blank rows = not tested) Comments: RLE NT on eval given symptom irritability  FUNCTIONAL TESTS:  5xSTS: standard chair, no UE support  GAIT: Distance walked: within clinic  Assistive device utilized: no AD, ASO R LE Level of assistance: Complete Independence Comments: mild antalgic gait on RLE, reduced gait speed/cadence   TODAY'S TREATMENT:   [facility] Adult PT Treatment:                                                DATE: 06-08-22 FOot intrinsics  Therapeutic Exercise: Seated heel/toe raises 2x8  Seated Great toe abduction  Yoga toes Great toe extension followed by digits 2 -5 extension Seated toe scrunches x12 NWB with towel Arch lifting without shoes 2 x 10 Airex lateral weight shift RLE progressing into step ups  2x8 CGA for safety Inversion with RTB  seated.  2 x 8 Seated gastroc stretch w strap 3x30sec Seated soleus stretch w strap 3x30sec Manual Therapy:  STW over posterior tibialis Trigger Point Dry-Needling performed     by Garen Lah Treatment instructions: Expect mild to moderate muscle soreness. S/S of pneumothorax if dry needled over a lung field, and to seek immediate medical attention should they occur. Patient verbalized understanding of these instructions and education.  Patient Consent Given: Yes Education handout provided: Previously provided Muscles treated:  R post. tibialis Electrical stimulation performed: No Parameters: N/A Treatment response/outcome: twitch response noted, pt noted relief at proximal end.  Modalities: Moist hot pack  [facility] Adult PT Treatment:                                                DATE: 05/31/22 Therapeutic Exercise: Seated heel/toe raises 2x8  Seated toe scrunches x12 NWB, 2x10 PWB with approx through thigh  Seated toe extension 2x12 seated cues for tripod Airex lateral weight shift RLE progressing into step ups x8 CGA for safety Inversion iso w ball 3x8 Seated gastroc stretch w strap 3x30sec Seated soleus stretch w strap 3x30sec Closed chain soleus stretch w and w/o MWM 1 each HEP update + education   [facility] Adult PT Treatment:                                                DATE: 05/23/22 Therapeutic Exercise: Toe scrunches 2x10 cues for tripod Seated heel raise 2x10 RLE cues for setup AP rockerboard x8 cues for control  Inversion iso w ball 3x8 cues for foot positioning RTB eversion 2x8 cues for setup Manual Therapy: TC distraction to pt tolerance, calcaneal distraction to pt tolerance STM to R anterior tib, gastroc soleus, post tib   [facility] Adult PT Treatment:                                                DATE: 05/18/22 Therapeutic Exercise: Seated heel  raise x10 RLE Toe scrunches x10 cues for tripod and reduced compensations HEP handout + education   PATIENT EDUCATION:  Education details: rationale for interventions, relevant anatomy/physiology Person educated: Patient Education method: Explanation, Demonstration, Tactile cues, Verbal cues, and Handouts Education comprehension: verbalized understanding, returned demonstration, verbal cues required, tactile cues required, and needs further education    HOME EXERCISE PROGRAM: Access Code: [email or web address] URL: [email or web address] Date: 05/31/2022 Prepared by: Fransisco Hertz  Program Notes - put weight through thigh for increased weightbearing with towel scrunches   Exercises - Towel Scrunches  - 1 x daily - 7 x weekly - 2-3 sets - 10 reps - Seated Heel Toe Raises  - 1 x daily - 7 x weekly - 2-3 sets - 8 reps - Seated Great Toe Extension  - 1 x daily - 7 x weekly - 2-3 sets - 12 reps Added 06-08-22 - Arch Lifting  - 1 x daily - 7 x weekly - 1 sets - 10 reps - Seated Figure 4 Ankle Inversion with Resistance  - 1 x daily - 7 x weekly - 3 sets - 10 reps ASSESSMENT:  CLINICAL IMPRESSION: 06/08/22     Pt arrives w/ 2/10 pain, notes that HEP is getting less irritating. Had about a day of soreness after last session. Today progressing volume and increased WB with foot intrinsic/extrinsic and ankle work today, pt reports gradual increase in pain/soreness as session goes on. Notes that open chain ankle DF stretching elicits anterior joint line discomfort - trial of MWM which irritates symptoms further, pt notes resolution of anterior joint pain with closed chain soleus stretching (limited by stretching sensation posteriorly). Pt politely declines manual on this date with aim of assessing effect on post session symptoms. Departs with 5/10 pain  on NPS, no adverse events, most difficult activity today is airex step ups. Recommend continuing along current POC in order to address relevant  deficits and improve functional tolerance. Pt departs today's session in no acute distress, all voiced questions/concerns addressed appropriately from PT perspective.    Per eval - Pt is a pleasant 27 year old woman who arrives to PT evaluation on this date for R ankle/foot pain after rolling her ankle at work in March 2023. Pt reports difficulty with prolonged standing, walking on even/uneven ground and inclines, and daily/work activities due to pain. During today's session pt demonstrates limitations in R ankle mobility in all planes (most concordant with inversion) which are limiting ability to perform aforementioned activities. MMT deferred given gradually increasing symptoms as exam goes on. Tolerates HEP well with education on appropriate home performance, no adverse events. Recommend skilled PT to address aforementioned deficits to improve functional independence/tolerance and progress back to work activities. Pt departs today's session in no acute distress, all voiced questions/concerns addressed appropriately from PT perspective.    OBJECTIVE IMPAIRMENTS: Abnormal gait, decreased activity tolerance, decreased balance, decreased endurance, decreased mobility, difficulty walking, decreased ROM, decreased strength, hypomobility, improper body mechanics, postural dysfunction, and pain.   ACTIVITY LIMITATIONS: carrying, lifting, standing, squatting, stairs, transfers, and locomotion level  PARTICIPATION LIMITATIONS: meal prep, cleaning, laundry, driving, community activity, and occupation   PERSONAL FACTORS: Time since onset of injury/illness/exacerbation and 1 comorbidity: palpitations w/ exercise  are also affecting patient's functional outcome.   REHAB POTENTIAL: Good  CLINICAL DECISION MAKING: Evolving/moderate complexity  EVALUATION COMPLEXITY: Low   GOALS: Goals reviewed with patient? No  SHORT TERM GOALS: Target date: 06/08/2022 Pt will demonstrate appropriate understanding and  performance of initially prescribed HEP in order to facilitate improved independence with management of symptoms.  Baseline: HEP provided on eval Goal status: INITIAL   2. Pt will score greater than or equal to 56 on FOTO in order to demonstrate improved perception of function due to symptoms.  Baseline: 49  Goal status: INITIAL    LONG TERM GOALS: Target date: 06/29/2022 Pt will score 64 or greater on FOTO in order to demonstrate improved perception of function due to symptoms.  Baseline: 49 Goal status: INITIAL  2.  Pt will demonstrate at least 12 degrees of ankle DF AROM in order to facilitate improved tolerance to functional movements such as squatting and walking up inclines. Baseline: see ROM chart above Goal status: INITIAL  3.  Pt will endorse ability to drive for up to 30 min with less than 2pt increase in pain on NPS in order to facilitate improved tolerance to community navigation.  Baseline: increased pain with minimal driving Goal status: INITIAL  4.  Pt will demonstrate ability to perform BIL heel raise and maintain for >15sec with less than 2 pt increase in pain in order to improve tolerance to laundry Baseline: top loading washer, pain/difficulty with loading due to heel raise per pt Goal status: INITIAL   5. Pt will demo/report ability to walk >45 min without seated rest, with less than 2pt increase on NPS in order to demo improved tolerance to recreational and work tasks.  Baseline: pain w/ >45min standing/walking  Goal status: INITIAL    PLAN:  PT FREQUENCY: 1-2x/week   PT DURATION: 6 weeks  PLANNED INTERVENTIONS: Therapeutic exercises, Therapeutic activity, Neuromuscular re-education, Balance training, Gait training, Patient/Family education, Self Care, Joint mobilization, Joint manipulation, Stair training, Aquatic Therapy, Dry Needling, Cryotherapy, Moist heat, Taping, Vasopneumatic device, Manual therapy,  and Re-evaluation  PLAN FOR NEXT SESSION:  review/update HEP PRN. Emphasis on foot intrinsic/extrinsic training, comfortable ankle mobility, edema control    Garen Lah, PT, [facility] Certified Exercise Expert for the Aging Adult  06/08/22 2:03 PM Phone: [phone] Fax: [phone]

## 2022-06-08 ENCOUNTER — Ambulatory Visit: Payer: PRIVATE HEALTH INSURANCE | Admitting: Physical Therapy

## 2022-06-08 ENCOUNTER — Encounter: Payer: Self-pay | Admitting: Physical Therapy

## 2022-06-08 DIAGNOSIS — M25571 Pain in right ankle and joints of right foot: Secondary | ICD-10-CM | POA: Diagnosis not present

## 2022-06-08 DIAGNOSIS — R2689 Other abnormalities of gait and mobility: Secondary | ICD-10-CM

## 2022-06-08 DIAGNOSIS — M25671 Stiffness of right ankle, not elsewhere classified: Secondary | ICD-10-CM

## 2022-06-08 DIAGNOSIS — R600 Localized edema: Secondary | ICD-10-CM

## 2022-06-08 NOTE — Patient Instructions (Signed)

## 2022-06-13 ENCOUNTER — Encounter: Payer: Self-pay | Admitting: Physical Therapy

## 2022-06-13 ENCOUNTER — Ambulatory Visit: Payer: PRIVATE HEALTH INSURANCE | Attending: Family Medicine | Admitting: Physical Therapy

## 2022-06-13 DIAGNOSIS — R2689 Other abnormalities of gait and mobility: Secondary | ICD-10-CM | POA: Insufficient documentation

## 2022-06-13 DIAGNOSIS — M25671 Stiffness of right ankle, not elsewhere classified: Secondary | ICD-10-CM | POA: Insufficient documentation

## 2022-06-13 DIAGNOSIS — M25571 Pain in right ankle and joints of right foot: Secondary | ICD-10-CM | POA: Diagnosis not present

## 2022-06-13 DIAGNOSIS — R600 Localized edema: Secondary | ICD-10-CM | POA: Diagnosis present

## 2022-06-13 NOTE — Therapy (Signed)
OUTPATIENT PHYSICAL THERAPY TREATMENT NOTE    Patient Name: Brooke Sharp MRN: [id number] DOB:1995-11-19, 27 y.o., female Today's Date: 06/13/2022  END OF SESSION:  PT End of Session - 06/13/22 1147     Visit Number 5    Number of Visits 10    Date for PT Re-Evaluation 06/29/22    Authorization Type worker's comp, [place]    Authorization - Visit Number 5    Authorization - Number of Visits 8    PT Start Time 1147    PT Stop Time 1229    PT Time Calculation (min) 42 min    Activity Tolerance Patient tolerated treatment well;Patient limited by pain    Behavior During Therapy WFL for tasks assessed/performed                 Past Medical History:  Diagnosis Date   Adjustment disorder with mixed anxiety and depressed mood 02/25/2020   COVID 09/2018   weakness, sob muscle cramps x 2 weeks all symptoms resolved   History of palpitations in adulthood    summer 2021, wore heart monitor from pcp dr Gabriel Rung [facility], issue resolved   Wears glasses 06/24/2020   Past Surgical History:  Procedure Laterality Date   left shoulder surgery Left 2017   labral tear   WISDOM TOOTH EXTRACTION  2017   Patient Active Problem List   Diagnosis Date Noted   Adjustment disorder with mixed anxiety and depressed mood 02/25/2020    PCP: No PCP in chart  REFERRING PROVIDER: Lanell Persons, MD  REFERRING DIAG: [phone] (ICD-10-CM) - Sprain of unspecified ligament of right ankle, subsequent encounter S93.601D (ICD-10-CM) - Unspecified sprain of right foot, subsequent encounter  THERAPY DIAG:  Pain in right ankle and joints of right foot  Stiffness of right ankle, not elsewhere classified  Other abnormalities of gait and mobility  Localized edema  Rationale for Evaluation and Treatment: Rehabilitation  ONSET DATE: End of March 2024  SUBJECTIVE:   SUBJECTIVE STATEMENT: 06/13/2022 Pt states she felt a little rough after last session, lasted  about a couple days. Notes her resting pain has improved somewhat, still having pain when walking. 3/10 pain on NPS   Per eval - Pt states in March she was carrying a linen bag that was fairly heavy, tripped over it and rolled her ankle. States she initially thought pain would go away as she has rolled her ankles in the past, but it persisted into next day and so she went to see doctor. She states they gave her a surgical shoe, WBAT, was told that it was sprained. Encouraged to ice and rest. States she has had two follow up appts since. No longer wearing surgical shoe, now wearing ankle brace and remains WBAT. States lateral pain has gradually improved, but anterior and medial pain has improved "about 10%". States swelling has improved a great deal but does persist when she's up on her feet. Now icing PRN for swelling. No N/T. Tends to be pretty sore by end of work day  PERTINENT HISTORY: Anxiety/depression, palpitations with exercise  PAIN:  Are you having pain:  3/10 Location/description: lateral ankle, medial and anterior; variable pain tends to go from throbbing>stabbing   Per eval:  Best-worst over past week: 3-6/10  - aggravating factors: driving, standing/walking >15 min, laundry (top loading, has to get on tip toes), uneven ground and hills  - Easing factors: rest, ice,     PRECAUTIONS: fall hx, palpitations with exercise (received  work up)  WEIGHT BEARING RESTRICTIONS: No  FALLS:  Has patient fallen in last 6 months? Yes. 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  PATIENT SURVEYS:  FOTO 49 current, 64 predicted  COGNITION: Overall cognitive status: Within functional limits for tasks assessed     SENSATION: Denies sensory complaints  EDEMA:  Mild swelling medial/anterior ankle around joint line, formal measurement deferred  PALPATION: Significant TTP anterior aspect of ankle, deltoid ligament, posterior to medial malleolus. Tenderness with palpation of distal gastroc/soleus with referral into medial ankle. No overt bony tenderness but is fairly tender circumferentially around medial malleolus   LOWER EXTREMITY ROM:  Active ROM Right eval Left eval  Knee flexion    Knee extension    Ankle dorsiflexion 8 deg mild pain * 12 deg  Ankle plantarflexion  45 deg  Ankle inversion 30 deg * 38 deg  Ankle eversion 12 deg* 15 deg   (Blank rows = not tested) Comments: concordant pain in anterior/dorsal foot with passive hallux extension, none with hallux flexion  LOWER EXTREMITY MMT:  MMT Right eval Left eval  Knee flexion    Knee extension    Ankle dorsiflexion  5  Ankle plantarflexion (modified sitting)  5  Ankle inversion  5  Ankle eversion  5   (Blank rows = not tested) Comments: RLE NT on eval given symptom irritability  FUNCTIONAL TESTS:  5xSTS: standard chair, no UE support  GAIT: Distance walked: within clinic  Assistive device utilized: no AD, ASO R LE Level of assistance: Complete Independence Comments: mild antalgic gait on RLE, reduced gait speed/cadence   TODAY'S TREATMENT:   [facility] Adult PT Treatment:                                                DATE: 06/13/22 Therapeutic Exercise: Seated heel/toe raises x12 Seated ankle inversion RTB x15 Heel raises, standing w/ 10sec hold x8 cues for symmetrical WB  Significant time spent with education on relevant  anatomy/physiology as it pertains to exercise in sessions, progression, symptom behavior  Neuromuscular re-ed: Post tib activation with arch lifting 2x12 tactile/verbal cues as needed  for post tib activation Seated toe yoga x12 cues for reduced post tib compensations Standing toe yoga for improved closed chain activation  x12 Toe splays x15 cues for reduced compensations with extension   [facility] Adult PT Treatment:                                                DATE: 06-08-22 FOot intrinsics  Therapeutic Exercise: Seated heel/toe raises 2x8  Seated Great toe abduction  Yoga toes Great toe extension followed by digits  2 -5 extension Seated toe scrunches x12 NWB with towel Arch lifting without shoes 2 x 10 Airex lateral weight shift RLE progressing into step ups  2x8 CGA for safety Inversion with RTB seated.  2 x 8 Seated gastroc stretch w strap 3x30sec Seated soleus stretch w strap 3x30sec Manual Therapy:  STW over posterior tibialis Trigger Point Dry-Needling performed     by Garen Lah Treatment instructions: Expect mild to moderate muscle soreness. S/S of pneumothorax if dry needled over a lung field, and to seek immediate medical attention should they occur. Patient verbalized understanding of these instructions and education.  Patient Consent Given: Yes Education handout provided: Previously provided Muscles treated:  R post. tibialis Electrical stimulation performed: No Parameters: N/A Treatment response/outcome: twitch response noted, pt noted relief at proximal end.  Modalities: Moist hot pack                                                                                                                        [facility] Adult PT Treatment:                                                DATE: 05/31/22 Therapeutic Exercise: Seated heel/toe raises 2x8  Seated toe scrunches x12 NWB, 2x10 PWB with approx through thigh  Seated toe extension 2x12 seated cues for tripod Airex  lateral weight shift RLE progressing into step ups x8 CGA for safety Inversion iso w ball 3x8 Seated gastroc stretch w strap 3x30sec Seated soleus stretch w strap 3x30sec Closed chain soleus stretch w and w/o MWM 1 each HEP update + education   [facility] Adult PT Treatment:                                                DATE: 05/23/22 Therapeutic Exercise: Toe scrunches 2x10 cues for tripod Seated heel raise 2x10 RLE cues for setup AP rockerboard x8 cues for control  Inversion iso w ball 3x8 cues for foot positioning RTB eversion 2x8 cues for setup Manual Therapy: TC distraction to pt tolerance, calcaneal distraction to pt tolerance STM to R anterior tib, gastroc soleus, post tib   [facility] Adult PT Treatment:                                                DATE: 05/18/22 Therapeutic Exercise: Seated heel raise x10 RLE Toe scrunches x10 cues for tripod and reduced compensations HEP handout + education   PATIENT EDUCATION:  Education details: rationale for interventions, relevant anatomy/physiology Person educated: Patient Education method: Explanation, Demonstration, Tactile cues, Verbal cues, and Handouts  Education comprehension: verbalized understanding, returned demonstration, verbal cues required, tactile cues required, and needs further education    HOME EXERCISE PROGRAM: Access Code: [email or web address] URL: [email or web address] Date: 05/31/2022 Prepared by: Fransisco Hertz  Program Notes - put weight through thigh for increased weightbearing with towel scrunches   Exercises - Towel Scrunches  - 1 x daily - 7 x weekly - 2-3 sets - 10 reps - Seated Heel Toe Raises  - 1 x daily - 7 x weekly - 2-3 sets - 8 reps - Seated Great Toe Extension  - 1 x daily - 7 x weekly - 2-3 sets - 12 reps Added 06-08-22 - Arch Lifting  - 1 x daily - 7 x weekly - 1 sets - 10 reps - Seated Figure 4 Ankle Inversion with Resistance  - 1 x daily - 7 x weekly - 3 sets - 10 reps ASSESSMENT:  CLINICAL  IMPRESSION: 06/13/22 Pt arrives w/ 3/10 pain, notes fairly significant soreness after last session that cleared up after a couple days although she notes needling seems to have resolved the muscular irritation in her proximal post tib. Today focusing on load management with exercises working on post tib tendon and reducing compensations, as well as progressing foot intrinsic/extrinsic stability with gradually increasing emphasis on closed chain, tactile cues provided to ensure appropriate post tib activation. Pt does endorse gradually increasing symptom irritability as session goes on, most provocation with direct theraband inversion. Significant time spent with education on relevant anatomy/physiology, symptom behavior, and modifying exercises based on tolerance. No adverse events, departs with 5/10 pain on NPS. Recommend continuing along current POC in order to address relevant deficits and improve functional tolerance. Pt departs today's session in no acute distress, all voiced questions/concerns addressed appropriately from PT perspective.     Per eval - Pt is a pleasant 27 year old woman who arrives to PT evaluation on this date for R ankle/foot pain after rolling her ankle at work in March 2023. Pt reports difficulty with prolonged standing, walking on even/uneven ground and inclines, and daily/work activities due to pain. During today's session pt demonstrates limitations in R ankle mobility in all planes (most concordant with inversion) which are limiting ability to perform aforementioned activities. MMT deferred given gradually increasing symptoms as exam goes on. Tolerates HEP well with education on appropriate home performance, no adverse events. Recommend skilled PT to address aforementioned deficits to improve functional independence/tolerance and progress back to work activities. Pt departs today's session in no acute distress, all voiced questions/concerns addressed appropriately from PT  perspective.    OBJECTIVE IMPAIRMENTS: Abnormal gait, decreased activity tolerance, decreased balance, decreased endurance, decreased mobility, difficulty walking, decreased ROM, decreased strength, hypomobility, improper body mechanics, postural dysfunction, and pain.   ACTIVITY LIMITATIONS: carrying, lifting, standing, squatting, stairs, transfers, and locomotion level  PARTICIPATION LIMITATIONS: meal prep, cleaning, laundry, driving, community activity, and occupation   PERSONAL FACTORS: Time since onset of injury/illness/exacerbation and 1 comorbidity: palpitations w/ exercise  are also affecting patient's functional outcome.   REHAB POTENTIAL: Good  CLINICAL DECISION MAKING: Evolving/moderate complexity  EVALUATION COMPLEXITY: Low   GOALS: Goals reviewed with patient? No  SHORT TERM GOALS: Target date: 06/08/2022 Pt will demonstrate appropriate understanding and performance of initially prescribed HEP in order to facilitate improved independence with management of symptoms.  Baseline: HEP provided on eval 06/13/22: reports excellent HEP adherence  Goal status: MET   2. Pt will score greater than or equal to 56 on FOTO in order to demonstrate improved  perception of function due to symptoms.  Baseline: 49  06/13/22: to be assessed next visit (visit #6)   Goal status: To be assessed   LONG TERM GOALS: Target date: 06/29/2022 Pt will score 64 or greater on FOTO in order to demonstrate improved perception of function due to symptoms.  Baseline: 49 Goal status: INITIAL  2.  Pt will demonstrate at least 12 degrees of ankle DF AROM in order to facilitate improved tolerance to functional movements such as squatting and walking up inclines. Baseline: see ROM chart above Goal status: INITIAL  3.  Pt will endorse ability to drive for up to 30 min with less than 2pt increase in pain on NPS in order to facilitate improved tolerance to community navigation.  Baseline: increased pain with  minimal driving Goal status: INITIAL  4.  Pt will demonstrate ability to perform BIL heel raise and maintain for >15sec with less than 2 pt increase in pain in order to improve tolerance to laundry Baseline: top loading washer, pain/difficulty with loading due to heel raise per pt Goal status: INITIAL   5. Pt will demo/report ability to walk >45 min without seated rest, with less than 2pt increase on NPS in order to demo improved tolerance to recreational and work tasks.  Baseline: pain w/ >66min standing/walking  Goal status: INITIAL    PLAN:  PT FREQUENCY: 1-2x/week   PT DURATION: 6 weeks  PLANNED INTERVENTIONS: Therapeutic exercises, Therapeutic activity, Neuromuscular re-education, Balance training, Gait training, Patient/Family education, Self Care, Joint mobilization, Joint manipulation, Stair training, Aquatic Therapy, Dry Needling, Cryotherapy, Moist heat, Taping, Vasopneumatic device, Manual therapy, and Re-evaluation  PLAN FOR NEXT SESSION: review/update HEP. FOTO. Load management for post tib tendon.    Ashley Murrain PT, DPT 06/13/2022 12:36 PM

## 2022-06-14 NOTE — Therapy (Signed)
OUTPATIENT PHYSICAL THERAPY TREATMENT NOTE    Patient Name: Brooke Sharp MRN: [id number] DOB:07/29/1995, 27 y.o., female Today's Date: 06/15/2022  END OF SESSION:  PT End of Session - 06/15/22 1148     Visit Number 6    Number of Visits 10    Date for PT Re-Evaluation 06/29/22    Authorization Type worker's comp, [place]    Authorization - Visit Number 6    Authorization - Number of Visits 8    PT Start Time 1147    PT Stop Time 1231    PT Time Calculation (min) 44 min    Activity Tolerance Patient tolerated treatment well    Behavior During Therapy WFL for tasks assessed/performed               Past Medical History:  Diagnosis Date   Adjustment disorder with mixed anxiety and depressed mood 02/25/2020   COVID 09/2018   weakness, sob muscle cramps x 2 weeks all symptoms resolved   History of palpitations in adulthood    summer 2021, wore heart monitor from pcp dr Gabriel Rung [facility], issue resolved   Wears glasses 06/24/2020   Past Surgical History:  Procedure Laterality Date   left shoulder surgery Left 2017   labral tear   WISDOM TOOTH EXTRACTION  2017   Patient Active Problem List   Diagnosis Date Noted   Adjustment disorder with mixed anxiety and depressed mood 02/25/2020    PCP: No PCP in chart  REFERRING PROVIDER: Lanell Persons, MD  REFERRING DIAG: [phone] (ICD-10-CM) - Sprain of unspecified ligament of right ankle, subsequent encounter S93.601D (ICD-10-CM) - Unspecified sprain of right foot, subsequent encounter  THERAPY DIAG:  Pain in right ankle and joints of right foot  Stiffness of right ankle, not elsewhere classified  Other abnormalities of gait and mobility  Localized edema  Rationale for Evaluation and Treatment: Rehabilitation  ONSET DATE: End of March 2024  SUBJECTIVE:   SUBJECTIVE STATEMENT: 06/15/2022 Pt states she had about a day of soreness after last session. Feeling better today, 2/10  at present. Still not having any post tib knots since needling. Notes improving tolerance compared to start of therapy, feels she is having more good days than bad days   Per eval - Pt states in March she was carrying a linen bag that was fairly heavy, tripped over it and rolled her ankle. States she initially thought pain would go away as she has rolled her ankles in the past, but it persisted into next day and so she went to see doctor. She states they gave her a surgical shoe, WBAT, was told that it was sprained. Encouraged to ice and rest. States she has had two follow up appts since. No longer wearing surgical shoe, now wearing ankle brace and remains WBAT. States lateral pain has gradually improved, but anterior and medial pain has improved "about 10%". States swelling has improved a great deal but does persist when she's up on her feet. Now icing PRN for swelling. No N/T. Tends to be pretty sore by end of work day  PERTINENT HISTORY: Anxiety/depression, palpitations with exercise   PAIN:  Are you having pain:  2/10 Location/description: lateral ankle, medial and anterior; variable pain tends to go from throbbing>stabbing   Per eval:  Best-worst over past week: 3-6/10  - aggravating factors: driving, standing/walking >15 min, laundry (top loading, has to get on tip toes), uneven ground and hills  - Easing factors: rest, ice,  PRECAUTIONS: fall hx, palpitations with exercise (received work up)  WEIGHT BEARING RESTRICTIONS: No  FALLS:  Has patient fallen in last 6 months? Yes. 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  PATIENT SURVEYS:  FOTO 49 current, 64 predicted 06/15/22 FOTO: 59   COGNITION: Overall cognitive status: Within functional limits for tasks assessed     SENSATION: Denies sensory complaints  EDEMA:  Mild swelling medial/anterior ankle around joint line, formal measurement deferred  PALPATION: Significant TTP anterior aspect of ankle, deltoid ligament, posterior to medial malleolus. Tenderness with palpation of distal gastroc/soleus with referral into medial ankle. No overt bony tenderness but is fairly tender circumferentially around medial malleolus   LOWER EXTREMITY ROM:  Active ROM Right eval Left eval  Knee flexion    Knee extension    Ankle dorsiflexion 8 deg mild pain * 12 deg  Ankle plantarflexion  45 deg  Ankle inversion 30 deg * 38 deg  Ankle eversion 12 deg* 15 deg   (Blank rows = not tested) Comments: concordant pain in anterior/dorsal foot with passive hallux extension, none with hallux flexion  LOWER EXTREMITY MMT:  MMT Right eval Left eval  Knee flexion    Knee extension    Ankle dorsiflexion  5  Ankle plantarflexion (modified sitting)  5  Ankle inversion  5  Ankle eversion  5   (Blank rows = not tested) Comments: RLE NT on eval given symptom irritability  FUNCTIONAL TESTS:  5xSTS: standard chair, no UE support  GAIT: Distance walked: within clinic  Assistive device utilized: no AD, ASO R LE Level of assistance: Complete Independence Comments: mild antalgic gait on RLE, reduced gait speed/cadence   TODAY'S TREATMENT:   [facility] Adult PT Treatment:                                                DATE: 06/15/22 Therapeutic Exercise: Standing heel raise 2x5 with 10sec cues for hold, barefoot  Superset RTB inversion 2x8 Inversion isometrics 2x5 with  10sec hold  RLE stance on airex with LLE tap backs on floor, x6  Continued discussion/education on relevant anatomy/physiology as it pertains to exercise and symptom behavior   Neuromuscular re-ed: Seated arch lifts x15 with min WB, tactile cues for post tib activation Seated arch lifts x15 with inc WB approximation through thigh, continues w/ tactile cues for post tib activation Standing toe raises 2x12 for improved closed chain stability w/ foot intrinsic/extrinsic activation Standing    [facility] Adult PT Treatment:                                                DATE: 06/13/22 Therapeutic Exercise:  Seated heel/toe raises x12 Seated ankle inversion RTB x15 Heel raises, standing w/ 10sec hold x8 cues for symmetrical WB  Significant time spent with education on relevant anatomy/physiology as it pertains to exercise in sessions, progression, symptom behavior  Neuromuscular re-ed: Post tib activation with arch lifting 2x12 tactile/verbal cues as needed  for post tib activation Seated toe yoga x12 cues for reduced post tib compensations Standing toe yoga for improved closed chain activation  x12 Toe splays x15 cues for reduced compensations with extension   [facility] Adult PT Treatment:                                                DATE: 06-08-22 FOot intrinsics  Therapeutic Exercise: Seated heel/toe raises 2x8  Seated Great toe abduction  Yoga toes Great toe extension followed by digits 2 -5 extension Seated toe scrunches x12 NWB with towel Arch lifting without shoes 2 x 10 Airex lateral weight shift RLE progressing into step ups  2x8 CGA for safety Inversion with RTB seated.  2 x 8 Seated gastroc stretch w strap 3x30sec Seated soleus stretch w strap 3x30sec Manual Therapy:  STW over posterior tibialis Trigger Point Dry-Needling performed     by Garen Lah Treatment instructions: Expect mild to moderate muscle soreness. S/S of pneumothorax if dry needled over a lung field, and to  seek immediate medical attention should they occur. Patient verbalized understanding of these instructions and education.  Patient Consent Given: Yes Education handout provided: Previously provided Muscles treated:  R post. tibialis Electrical stimulation performed: No Parameters: N/A Treatment response/outcome: twitch response noted, pt noted relief at proximal end.  Modalities: Moist hot pack                                                                                                                        [facility] Adult PT Treatment:                                                DATE: 05/31/22 Therapeutic Exercise: Seated heel/toe raises 2x8  Seated toe scrunches x12 NWB, 2x10 PWB with approx through thigh  Seated toe extension 2x12 seated cues for tripod Airex lateral weight shift RLE progressing into step ups x8 CGA for safety Inversion iso w ball 3x8 Seated gastroc stretch w strap 3x30sec Seated soleus stretch w strap 3x30sec Closed chain soleus stretch w and w/o MWM 1 each HEP update + education   [facility] Adult PT Treatment:  DATE: 05/23/22 Therapeutic Exercise: Toe scrunches 2x10 cues for tripod Seated heel raise 2x10 RLE cues for setup AP rockerboard x8 cues for control  Inversion iso w ball 3x8 cues for foot positioning RTB eversion 2x8 cues for setup Manual Therapy: TC distraction to pt tolerance, calcaneal distraction to pt tolerance STM to R anterior tib, gastroc soleus, post tib   [facility] Adult PT Treatment:                                                DATE: 05/18/22 Therapeutic Exercise: Seated heel raise x10 RLE Toe scrunches x10 cues for tripod and reduced compensations HEP handout + education   PATIENT EDUCATION:  Education details: rationale for interventions, relevant anatomy/physiology Person educated: Patient Education method: Explanation, Demonstration, Tactile cues, Verbal cues, and Handouts Education  comprehension: verbalized understanding, returned demonstration, verbal cues required, tactile cues required, and needs further education    HOME EXERCISE PROGRAM: Access Code: [email or web address] URL: [email or web address] Date: 05/31/2022 Prepared by: Fransisco Hertz  Program Notes - put weight through thigh for increased weightbearing with towel scrunches   Exercises - Towel Scrunches  - 1 x daily - 7 x weekly - 2-3 sets - 10 reps - Seated Heel Toe Raises  - 1 x daily - 7 x weekly - 2-3 sets - 8 reps - Seated Great Toe Extension  - 1 x daily - 7 x weekly - 2-3 sets - 12 reps Added 06-08-22 - Arch Lifting  - 1 x daily - 7 x weekly - 1 sets - 10 reps - Seated Figure 4 Ankle Inversion with Resistance  - 1 x daily - 7 x weekly - 3 sets - 10 reps ASSESSMENT:  CLINICAL IMPRESSION: 06/15/22 Pt arrives w/ 2/10 pain, notes about a day of soreness after last session but feels her tolerance is improving and she is having more good days than when she first started PT. Today continuing to progress post tib loading program with increased volume and reintroduction of isometrics in supersets, pt continues to endorse gradual increase in pain as session goes on but describes it as being comparable to last session. Also able to progress for increased emphasis on closed chain foot stability. No adverse events, pt departs with 5/10 pain. Deferring HEP update, instead favoring alteration in frequency of performance with aim to improve tolerance to daily activities (reports some soreness with morning performance, currently splitting between morning and night, will change to full performance in evening). Recommend continuing along current POC in order to address relevant deficits and improve functional tolerance. Pt departs today's session in no acute distress, all voiced questions/concerns addressed appropriately from PT perspective.     Per eval - Pt is a pleasant 27 year old woman who arrives to PT evaluation  on this date for R ankle/foot pain after rolling her ankle at work in March 2023. Pt reports difficulty with prolonged standing, walking on even/uneven ground and inclines, and daily/work activities due to pain. During today's session pt demonstrates limitations in R ankle mobility in all planes (most concordant with inversion) which are limiting ability to perform aforementioned activities. MMT deferred given gradually increasing symptoms as exam goes on. Tolerates HEP well with education on appropriate home performance, no adverse events. Recommend skilled PT to address aforementioned deficits to improve functional independence/tolerance and progress back to work activities. Pt departs today's session  in no acute distress, all voiced questions/concerns addressed appropriately from PT perspective.    OBJECTIVE IMPAIRMENTS: Abnormal gait, decreased activity tolerance, decreased balance, decreased endurance, decreased mobility, difficulty walking, decreased ROM, decreased strength, hypomobility, improper body mechanics, postural dysfunction, and pain.   ACTIVITY LIMITATIONS: carrying, lifting, standing, squatting, stairs, transfers, and locomotion level  PARTICIPATION LIMITATIONS: meal prep, cleaning, laundry, driving, community activity, and occupation   PERSONAL FACTORS: Time since onset of injury/illness/exacerbation and 1 comorbidity: palpitations w/ exercise  are also affecting patient's functional outcome.   REHAB POTENTIAL: Good  CLINICAL DECISION MAKING: Evolving/moderate complexity  EVALUATION COMPLEXITY: Low   GOALS: Goals reviewed with patient? No  SHORT TERM GOALS: Target date: 06/08/2022 Pt will demonstrate appropriate understanding and performance of initially prescribed HEP in order to facilitate improved independence with management of symptoms.  Baseline: HEP provided on eval 06/13/22: reports excellent HEP adherence  Goal status: MET   2. Pt will score greater than or equal  to 56 on FOTO in order to demonstrate improved perception of function due to symptoms.  Baseline: 49  06/13/22: to be assessed next visit (visit #6)   06/15/22: 59  Goal status: MET   LONG TERM GOALS: Target date: 06/29/2022 Pt will score 64 or greater on FOTO in order to demonstrate improved perception of function due to symptoms.  Baseline: 49 Goal status: INITIAL  2.  Pt will demonstrate at least 12 degrees of ankle DF AROM in order to facilitate improved tolerance to functional movements such as squatting and walking up inclines. Baseline: see ROM chart above Goal status: INITIAL  3.  Pt will endorse ability to drive for up to 30 min with less than 2pt increase in pain on NPS in order to facilitate improved tolerance to community navigation.  Baseline: increased pain with minimal driving Goal status: INITIAL  4.  Pt will demonstrate ability to perform BIL heel raise and maintain for >15sec with less than 2 pt increase in pain in order to improve tolerance to laundry Baseline: top loading washer, pain/difficulty with loading due to heel raise per pt Goal status: INITIAL   5. Pt will demo/report ability to walk >45 min without seated rest, with less than 2pt increase on NPS in order to demo improved tolerance to recreational and work tasks.  Baseline: pain w/ >21min standing/walking  Goal status: INITIAL    PLAN:  PT FREQUENCY: 1-2x/week   PT DURATION: 6 weeks  PLANNED INTERVENTIONS: Therapeutic exercises, Therapeutic activity, Neuromuscular re-education, Balance training, Gait training, Patient/Family education, Self Care, Joint mobilization, Joint manipulation, Stair training, Aquatic Therapy, Dry Needling, Cryotherapy, Moist heat, Taping, Vasopneumatic device, Manual therapy, and Re-evaluation  PLAN FOR NEXT SESSION: review/update HEP. Load management for post tib tendon, gradually progress into more WB activities   Ashley Murrain PT, DPT 06/15/2022 12:57 PM

## 2022-06-15 ENCOUNTER — Ambulatory Visit: Payer: PRIVATE HEALTH INSURANCE | Attending: Family Medicine | Admitting: Physical Therapy

## 2022-06-15 ENCOUNTER — Encounter: Payer: Self-pay | Admitting: Physical Therapy

## 2022-06-15 DIAGNOSIS — R600 Localized edema: Secondary | ICD-10-CM | POA: Insufficient documentation

## 2022-06-15 DIAGNOSIS — M25571 Pain in right ankle and joints of right foot: Secondary | ICD-10-CM | POA: Insufficient documentation

## 2022-06-15 DIAGNOSIS — R2689 Other abnormalities of gait and mobility: Secondary | ICD-10-CM | POA: Insufficient documentation

## 2022-06-15 DIAGNOSIS — M25671 Stiffness of right ankle, not elsewhere classified: Secondary | ICD-10-CM | POA: Diagnosis present

## 2022-06-20 ENCOUNTER — Ambulatory Visit: Payer: PRIVATE HEALTH INSURANCE | Attending: Family Medicine | Admitting: Physical Therapy

## 2022-06-20 ENCOUNTER — Encounter: Payer: Self-pay | Admitting: Physical Therapy

## 2022-06-20 DIAGNOSIS — M25671 Stiffness of right ankle, not elsewhere classified: Secondary | ICD-10-CM | POA: Insufficient documentation

## 2022-06-20 DIAGNOSIS — M25571 Pain in right ankle and joints of right foot: Secondary | ICD-10-CM | POA: Diagnosis not present

## 2022-06-20 NOTE — Therapy (Signed)
OUTPATIENT PHYSICAL THERAPY TREATMENT NOTE    Patient Name: Brooke Sharp MRN: [id number] DOB:[birth year], 27 y.o., female Today's Date: 06/20/2022  END OF SESSION:  PT End of Session - 06/20/22 1152     Visit Number 7    Number of Visits 10    Date for PT Re-Evaluation 06/29/22    Authorization Type worker's comp, [place]    Authorization - Visit Number 7    Authorization - Number of Visits 8    PT Start Time 1150    PT Stop Time 1228    PT Time Calculation (min) 38 min               Past Medical History:  Diagnosis Date   Adjustment disorder with mixed anxiety and depressed mood 02/25/2020   COVID 09/2018   weakness, sob muscle cramps x 2 weeks all symptoms resolved   History of palpitations in adulthood    summer 2021, wore heart monitor from pcp dr Gabriel Rung [facility], issue resolved   Wears glasses 06/24/2020   Past Surgical History:  Procedure Laterality Date   left shoulder surgery Left 2017   labral tear   WISDOM TOOTH EXTRACTION  2017   Patient Active Problem List   Diagnosis Date Noted   Adjustment disorder with mixed anxiety and depressed mood 02/25/2020    PCP: No PCP in chart  REFERRING PROVIDER: Lanell Persons, MD  REFERRING DIAG: [phone] (ICD-10-CM) - Sprain of unspecified ligament of right ankle, subsequent encounter S93.601D (ICD-10-CM) - Unspecified sprain of right foot, subsequent encounter  THERAPY DIAG:  Pain in right ankle and joints of right foot  Stiffness of right ankle, not elsewhere classified  Rationale for Evaluation and Treatment: Rehabilitation  ONSET DATE: End of March 2024  SUBJECTIVE:   SUBJECTIVE STATEMENT: 06/20/2022 have 24 hours of soreness after PT. 2/10 today. I've seen a lot of improvement in the last 2 weeks.    Per eval - Pt states in March she was carrying a linen bag that was fairly heavy, tripped over it and rolled her ankle. States she initially thought pain would  go away as she has rolled her ankles in the past, but it persisted into next day and so she went to see doctor. She states they gave her a surgical shoe, WBAT, was told that it was sprained. Encouraged to ice and rest. States she has had two follow up appts since. No longer wearing surgical shoe, now wearing ankle brace and remains WBAT. States lateral pain has gradually improved, but anterior and medial pain has improved "about 10%". States swelling has improved a great deal but does persist when she's up on her feet. Now icing PRN for swelling. No N/T. Tends to be pretty sore by end of work day  PERTINENT HISTORY: Anxiety/depression, palpitations with exercise   PAIN:  Are you having pain:  2/10 Location/description: lateral ankle, medial and anterior; variable pain tends to go from throbbing>stabbing   Per eval:  Best-worst over past week: 3-6/10  - aggravating factors: driving, standing/walking >15 min, laundry (top loading, has to get on tip toes), uneven ground and hills  - Easing factors: rest, ice,     PRECAUTIONS: fall hx, palpitations with exercise (received work up)  WEIGHT BEARING RESTRICTIONS: No  FALLS:  Has patient fallen in last 6 months? Yes. 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  PATIENT SURVEYS:  FOTO 49 current, 64 predicted 06/15/22 FOTO: 59   COGNITION: Overall cognitive status: Within  functional limits for tasks assessed     SENSATION: Denies sensory complaints  EDEMA:  Mild swelling medial/anterior ankle around joint line, formal measurement deferred  PALPATION: Significant TTP anterior aspect of ankle, deltoid ligament, posterior to medial malleolus. Tenderness with palpation of distal gastroc/soleus with referral into medial ankle. No overt bony tenderness but is fairly tender circumferentially around medial malleolus   LOWER EXTREMITY ROM:  Active ROM Right eval Left eval Right 06/20/22  Knee flexion     Knee extension     Ankle dorsiflexion 8 deg mild pain * 12 deg 7 deg supine  Ankle plantarflexion  45 deg   Ankle inversion 30 deg * 38 deg   Ankle eversion 12 deg* 15 deg    (Blank rows = not tested) Comments: concordant pain in anterior/dorsal foot with passive hallux extension, none with hallux flexion  LOWER EXTREMITY MMT:  MMT Right eval Left eval  Knee flexion    Knee extension    Ankle dorsiflexion  5  Ankle plantarflexion (modified sitting)  5  Ankle inversion  5  Ankle eversion  5   (Blank rows = not tested) Comments: RLE NT on eval given symptom irritability  FUNCTIONAL TESTS:  5xSTS: standard chair, no UE support  GAIT: Distance walked: within clinic  Assistive device utilized: no AD, ASO R LE Level of assistance: Complete Independence Comments: mild antalgic gait on RLE, reduced gait speed/cadence   TODAY'S TREATMENT:   [facility] Adult PT Treatment:                                                DATE: 06/20/22 Therapeutic Exercise: Bil heel raise hold for 60 sec (shoes on)  AIREX tandem stance  AIREX heel and toe rises AIREX marching  Runners stretch 30 sec  Soleus stretch 30 sec  Seated Towel Toe  Scrunches  Seated Arch Lift  Standing Arch Lift  Toe Yoga  seated and standing RTB inversion 2x8 Seated gastroc and soleus stretches with strap    [facility] Adult PT Treatment:                                                DATE:  06/15/22 Therapeutic Exercise: Standing heel raise 2x5 with 10sec cues for hold, barefoot  Superset RTB inversion 2x8 Inversion isometrics 2x5 with 10sec hold  RLE stance on airex with LLE tap backs on floor, x6  Continued discussion/education on relevant anatomy/physiology as it pertains to exercise and symptom behavior   Neuromuscular re-ed: Seated arch lifts x15 with min WB, tactile cues for post tib activation Seated arch lifts x15 with inc WB approximation through thigh,  continues w/ tactile cues for post tib activation Standing toe raises 2x12 for improved closed chain stability w/ foot intrinsic/extrinsic activation Standing    [facility] Adult PT Treatment:                                                DATE: 06/13/22 Therapeutic Exercise: Seated heel/toe raises x12 Seated ankle inversion RTB x15 Heel raises, standing w/ 10sec hold x8 cues for symmetrical WB  Significant time spent with education on relevant anatomy/physiology as it pertains to exercise in sessions, progression, symptom behavior  Neuromuscular re-ed: Post tib activation with arch lifting 2x12 tactile/verbal cues as needed  for post tib activation Seated toe yoga x12 cues for reduced post tib compensations Standing toe yoga for improved closed chain activation  x12 Toe splays x15 cues for reduced compensations with extension   [facility] Adult PT Treatment:                                                DATE: 06-08-22 FOot intrinsics  Therapeutic Exercise: Seated heel/toe raises 2x8  Seated Great toe abduction  Yoga toes Great toe extension followed by digits 2 -5 extension Seated toe scrunches x12 NWB with towel Arch lifting without shoes 2 x 10 Airex lateral weight shift RLE progressing into step ups  2x8 CGA for safety Inversion with RTB seated.  2 x 8 Seated gastroc stretch w strap 3x30sec Seated soleus stretch w strap 3x30sec Manual Therapy:  STW over posterior tibialis Trigger Point Dry-Needling performed      by Garen Lah Treatment instructions: Expect mild to moderate muscle soreness. S/S of pneumothorax if dry needled over a lung field, and to seek immediate medical attention should they occur. Patient verbalized understanding of these instructions and education.  Patient Consent Given: Yes Education handout provided: Previously provided Muscles treated:  R post. tibialis Electrical stimulation performed: No Parameters: N/A Treatment response/outcome: twitch response noted, pt noted relief at proximal end.  Modalities: Moist hot pack                                                                                                                        [facility] Adult PT Treatment:                                                DATE: 05/31/22 Therapeutic Exercise: Seated heel/toe raises 2x8  Seated toe scrunches x12 NWB, 2x10 PWB with approx through thigh  Seated toe extension 2x12 seated cues for tripod Airex lateral weight shift RLE progressing into step ups x8 CGA for safety  Inversion iso w ball 3x8 Seated gastroc stretch w strap 3x30sec Seated soleus stretch w strap 3x30sec Closed chain soleus stretch w and w/o MWM 1 each HEP update + education      PATIENT EDUCATION:  Education details: rationale for interventions, relevant anatomy/physiology Person educated: Patient Education method: Explanation, Demonstration, Tactile cues, Verbal cues, and Handouts Education comprehension: verbalized understanding, returned demonstration, verbal cues required, tactile cues required, and needs further education    HOME EXERCISE PROGRAM: Access Code: [email or web address] URL: [email or web address] Date: 05/31/2022 Prepared by: Fransisco Hertz  Program Notes - put weight through thigh for increased weightbearing with towel scrunches   Exercises - Towel Scrunches  - 1 x daily - 7 x weekly - 2-3 sets - 10 reps - Seated Heel Toe Raises  - 1 x daily - 7 x weekly - 2-3 sets - 8 reps -  Seated Great Toe Extension  - 1 x daily - 7 x weekly - 2-3 sets - 12 reps Added 06-08-22 - Arch Lifting  - 1 x daily - 7 x weekly - 1 sets - 10 reps - Seated Figure 4 Ankle Inversion with Resistance  - 1 x daily - 7 x weekly - 3 sets - 10 reps ASSESSMENT:  CLINICAL IMPRESSION: 06/20/22 Pt reports 2/10 pain on arrival. She reports improving tolerance to driving and walking. She can maintain bil heel raise for 60 seconds, meeting LTG #4 . Continued with intrinsic foot strength and ankle stability. Completed closed and open chain gastroc stretching. Cold pack provided at end of session.    Per eval - Pt is a pleasant 27 year old woman who arrives to PT evaluation on this date for R ankle/foot pain after rolling her ankle at work in March 2023. Pt reports difficulty with prolonged standing, walking on even/uneven ground and inclines, and daily/work activities due to pain. During today's session pt demonstrates limitations in R ankle mobility in all planes (most concordant with inversion) which are limiting ability to perform aforementioned activities. MMT deferred given gradually increasing symptoms as exam goes on. Tolerates HEP well with education on appropriate home performance, no adverse events. Recommend skilled PT to address aforementioned deficits to improve functional independence/tolerance and progress back to work activities. Pt departs today's session in no acute distress, all voiced questions/concerns addressed appropriately from PT perspective.    OBJECTIVE IMPAIRMENTS: Abnormal gait, decreased activity tolerance, decreased balance, decreased endurance, decreased mobility, difficulty walking, decreased ROM, decreased strength, hypomobility, improper body mechanics, postural dysfunction, and pain.   ACTIVITY LIMITATIONS: carrying, lifting, standing, squatting, stairs, transfers, and locomotion level  PARTICIPATION LIMITATIONS: meal prep, cleaning, laundry, driving, community activity, and  occupation   PERSONAL FACTORS: Time since onset of injury/illness/exacerbation and 1 comorbidity: palpitations w/ exercise  are also affecting patient's functional outcome.   REHAB POTENTIAL: Good  CLINICAL DECISION MAKING: Evolving/moderate complexity  EVALUATION COMPLEXITY: Low   GOALS: Goals reviewed with patient? No  SHORT TERM GOALS: Target date: 06/08/2022 Pt will demonstrate appropriate understanding and performance of initially prescribed HEP in order to facilitate improved independence with management of symptoms.  Baseline: HEP provided on eval 06/13/22: reports excellent HEP adherence  Goal status: MET   2. Pt will score greater than or equal to 56 on FOTO in order to demonstrate improved perception of function due to symptoms.  Baseline: 49  06/13/22: to be assessed next visit (visit #6)   06/15/22: 59  Goal status: MET   LONG TERM GOALS: Target date: 06/29/2022 Pt will score 64 or  greater on FOTO in order to demonstrate improved perception of function due to symptoms.  Baseline: 49 Goal status: INITIAL  2.  Pt will demonstrate at least 12 degrees of ankle DF AROM in order to facilitate improved tolerance to functional movements such as squatting and walking up inclines. Baseline: see ROM chart above Goal status: INITIAL  3.  Pt will endorse ability to drive for up to 30 min with less than 2pt increase in pain on NPS in order to facilitate improved tolerance to community navigation.  Baseline: increased pain with minimal driving 1/61/09: improving, not yet at target  Goal status: ONGOING  4.  Pt will demonstrate ability to perform BIL heel raise and maintain for >15sec with less than 2 pt increase in pain in order to improve tolerance to laundry Baseline: top loading washer, pain/difficulty with loading due to heel raise per pt 06/20/22: 60 sec  Goal status: MET  5. Pt will demo/report ability to walk >45 min without seated rest, with less than 2pt increase on NPS in  order to demo improved tolerance to recreational and work tasks.  Baseline: pain w/ >19min standing/walking  06/20/22: 30 minutes   Goal status: ONGOING   PLAN:  PT FREQUENCY: 1-2x/week   PT DURATION: 6 weeks  PLANNED INTERVENTIONS: Therapeutic exercises, Therapeutic activity, Neuromuscular re-education, Balance training, Gait training, Patient/Family education, Self Care, Joint mobilization, Joint manipulation, Stair training, Aquatic Therapy, Dry Needling, Cryotherapy, Moist heat, Taping, Vasopneumatic device, Manual therapy, and Re-evaluation  PLAN FOR NEXT SESSION: review/update HEP. Load management for post tib tendon, gradually progress into more WB activities   Jannette Spanner, [place] 06/20/22 12:50 PM Phone: [phone] Fax: [phone]

## 2022-06-21 DIAGNOSIS — N711 Chronic inflammatory disease of uterus: Secondary | ICD-10-CM | POA: Diagnosis not present

## 2022-06-21 DIAGNOSIS — Z113 Encounter for screening for infections with a predominantly sexual mode of transmission: Secondary | ICD-10-CM | POA: Diagnosis not present

## 2022-06-22 ENCOUNTER — Encounter: Payer: Self-pay | Admitting: Physical Therapy

## 2022-06-22 ENCOUNTER — Ambulatory Visit: Payer: PRIVATE HEALTH INSURANCE | Attending: Family Medicine | Admitting: Physical Therapy

## 2022-06-22 DIAGNOSIS — R600 Localized edema: Secondary | ICD-10-CM | POA: Diagnosis present

## 2022-06-22 DIAGNOSIS — M25671 Stiffness of right ankle, not elsewhere classified: Secondary | ICD-10-CM | POA: Insufficient documentation

## 2022-06-22 DIAGNOSIS — R2689 Other abnormalities of gait and mobility: Secondary | ICD-10-CM | POA: Diagnosis present

## 2022-06-22 DIAGNOSIS — M25571 Pain in right ankle and joints of right foot: Secondary | ICD-10-CM | POA: Insufficient documentation

## 2022-06-22 NOTE — Therapy (Signed)
OUTPATIENT PHYSICAL THERAPY PROGRESS NOTE + RECERTIFICATION   Patient Name: Bobbiesue Verstraete MRN: [id number] DOB:13-Jun-1995, 27 y.o., female Today's Date: 06/22/2022   Progress Note Reporting Period 05/18/22 to 06/22/22  See note below for Objective Data and Assessment of Progress/Goals.      END OF SESSION:  PT End of Session - 06/22/22 1100     Visit Number 8    Number of Visits 12    Date for PT Re-Evaluation 07/20/22    Authorization Type worker's comp, [place]    Authorization - Visit Number 8    Authorization - Number of Visits 8    PT Start Time 1101    PT Stop Time 1148    PT Time Calculation (min) 47 min    Activity Tolerance Patient tolerated treatment well    Behavior During Therapy WFL for tasks assessed/performed                Past Medical History:  Diagnosis Date   Adjustment disorder with mixed anxiety and depressed mood 02/25/2020   COVID 09/2018   weakness, sob muscle cramps x 2 weeks all symptoms resolved   History of palpitations in adulthood    summer 2021, wore heart monitor from pcp dr Gabriel Rung [facility], issue resolved   Wears glasses 06/24/2020   Past Surgical History:  Procedure Laterality Date   left shoulder surgery Left 2017   labral tear   WISDOM TOOTH EXTRACTION  2017   Patient Active Problem List   Diagnosis Date Noted   Adjustment disorder with mixed anxiety and depressed mood 02/25/2020    PCP: No PCP in chart  REFERRING PROVIDER: Lanell Persons, MD  REFERRING DIAG: [phone] (ICD-10-CM) - Sprain of unspecified ligament of right ankle, subsequent encounter S93.601D (ICD-10-CM) - Unspecified sprain of right foot, subsequent encounter  THERAPY DIAG:  Pain in right ankle and joints of right foot  Stiffness of right ankle, not elsewhere classified  Other abnormalities of gait and mobility  Localized edema  Rationale for Evaluation and Treatment: Rehabilitation  ONSET DATE: End of  March 2024  SUBJECTIVE:   SUBJECTIVE STATEMENT: 06/22/2022 No pain at present. Notes she is no longer having pain with every step. Able to tolerate standing >17min of standing without pain, mild increase that resolves with seated rest. Still needing breaks. Walking getting easier, feels like she is limping less and doing better with laundry. Still having difficulty with standing/walking situations where she can't have seated rest.    Per eval - Pt states in March she was carrying a linen bag that was fairly heavy, tripped over it and rolled her ankle. States she initially thought pain would go away as she has rolled her ankles in the past, but it persisted into next day and so she went to see doctor. She states they gave her a surgical shoe, WBAT, was told that it was sprained. Encouraged to ice and rest. States she has had two follow up appts since. No longer wearing surgical shoe, now wearing ankle brace and remains WBAT. States lateral pain has gradually improved, but anterior and medial pain has improved "about 10%". States swelling has improved a great deal but does persist when she's up on her feet. Now icing PRN for swelling. No N/T. Tends to be pretty sore by end of work day  PERTINENT HISTORY: Anxiety/depression, palpitations with exercise   PAIN:  Are you having pain:  0/10 Location/description: lateral ankle, medial and anterior; variable pain tends to  go from throbbing>stabbing  Best to worst past week: 0-   Per eval:  Best-worst over past week: 3-6/10  - aggravating factors: driving, standing/walking >15 min, laundry (top loading, has to get on tip toes), uneven ground and hills  - Easing factors: rest, ice,     PRECAUTIONS: fall hx, palpitations with exercise (received work up)  WEIGHT BEARING RESTRICTIONS: No  FALLS:  Has patient fallen in last 6 months? Yes. 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  PATIENT SURVEYS:  FOTO 49 current, 64 predicted 06/15/22 FOTO: 59  06/22/22 FOTO 72  COGNITION: Overall cognitive status: Within functional limits for tasks assessed     SENSATION: Denies sensory complaints  EDEMA:  Mild swelling medial/anterior ankle around joint line, formal measurement deferred  PALPATION: Significant TTP anterior aspect of ankle, deltoid ligament, posterior to medial malleolus. Tenderness with palpation of distal gastroc/soleus with referral into medial ankle. No overt bony tenderness but is fairly tender circumferentially around medial malleolus   LOWER EXTREMITY ROM:  Active ROM Right eval Left eval Right 06/20/22 Right 06/22/22 AROM   Knee flexion      Knee extension      Ankle dorsiflexion 8 deg mild pain * 12 deg 7 deg supine A: 9 deg long sitting no pain  P: 10 deg painless   Ankle plantarflexion  45 deg    Ankle inversion 30 deg * 38 deg    Ankle eversion 12 deg* 15 deg     (Blank rows = not tested) Comments: concordant pain in anterior/dorsal foot with passive hallux extension, none with hallux flexion  LOWER EXTREMITY MMT:  MMT Right eval Left eval  Knee flexion    Knee extension    Ankle dorsiflexion  5  Ankle plantarflexion (modified sitting)  5  Ankle inversion  5  Ankle eversion  5   (Blank rows = not tested) Comments: RLE NT on eval given symptom irritability  FUNCTIONAL TESTS:  5xSTS: standard chair, no UE  support  GAIT: Distance walked: within clinic  Assistive device utilized: no AD, ASO R LE Level of assistance: Complete Independence Comments: mild antalgic gait on RLE, reduced gait speed/cadence   TODAY'S TREATMENT:   [facility] Adult PT Treatment:                                                DATE: 06/22/22 Therapeutic Exercise: GTB inversion 2x8 cues for pacing Post tib heel raises with ball at counter, 2x8 cues for pacing LLE marches on airex for RLE stability, 2x12 cues for form and weaning UE support as able HEP update/review  Education on relevant anatomy/physiology as it pertains to exercise in session, HEP, and general exercise outside of sessions   Therapeutic Activity: MSK assessment + education FOTO + education Education/discussion re:  progress with PT, symptom behavior as it affects activity tolerance, PT goals/POC  Education on relevant anatomy/physiology as it pertains to activity tolerance, HEP, and safe progression of activities based on symptom tolerance    PATIENT EDUCATION:  Education details: rationale for interventions, relevant anatomy/physiology, progress w/ PT thus far, safe progression of activities with regard to symptom behavior Person educated: Patient Education method: Explanation, Demonstration, Tactile cues, Verbal cues, and Handouts Education comprehension: verbalized understanding, returned demonstration, verbal cues required, tactile cues required, and needs further education    HOME EXERCISE PROGRAM: Access Code: [email or web address] URL: [email or web address] Date: 06/22/2022 Prepared by: Fransisco Hertz  Exercises - Towel Scrunches  - 1 x daily - 7 x weekly - 2-3 sets - 10 reps - Arch Lifting  - 1 x daily - 7 x weekly - 2-3 sets - 10 reps - Seated Figure 4 Ankle Inversion with Resistance  - 1 x daily - 7 x weekly - 3 sets - 10 reps - Heel Raises with Counter Support  - 1 x daily - 7 x weekly - 3 sets - 10 reps   ASSESSMENT:  CLINICAL  IMPRESSION: 06/22/22 Pt arrives w/o pain, endorses notable improvement over past couple of weeks. Looking at LTGs below pt has made good progress, although she continues to endorse moderate pain when she is unable to take rest breaks. Pt discussion re: progress w/ PT thus far and her tolerance to daily activities/mobility, mutual decision made to update POC for 1x/week for 4 weeks as she is endorsing steadily improving progress with exercise/activity tolerance. Follows with MD next week. Pt endorses mild increase in symptoms as exercise goes on but notes improving compared to previous sessions, no adverse events and departs w/ 3/10 pain. Recommend continuing along updated POC in order to address relevant deficits and improve functional tolerance. Pt departs today's session in no acute distress, all voiced questions/concerns addressed appropriately from PT perspective.      Per eval - Pt is a pleasant 27 year old woman who arrives to PT evaluation on this date for R ankle/foot pain after rolling her ankle at work in March 2023. Pt reports difficulty with prolonged standing, walking on even/uneven ground and inclines, and daily/work activities due to pain. During today's session pt demonstrates limitations in R ankle mobility in all planes (most concordant with inversion) which are limiting ability to perform aforementioned activities. MMT deferred given gradually increasing symptoms as exam goes on. Tolerates HEP well with education on appropriate home performance, no adverse events. Recommend skilled PT to address aforementioned deficits to improve functional independence/tolerance and progress back to work activities. Pt departs today's session in no acute distress, all voiced questions/concerns addressed appropriately from PT perspective.    OBJECTIVE IMPAIRMENTS: Abnormal gait, decreased activity tolerance, decreased balance, decreased endurance, decreased mobility, difficulty walking, decreased ROM,  decreased strength, hypomobility, improper body mechanics, postural dysfunction, and pain.   ACTIVITY LIMITATIONS: carrying, lifting, standing, squatting, stairs, transfers, and locomotion level  PARTICIPATION LIMITATIONS: meal prep, cleaning, laundry, driving, community activity, and occupation   PERSONAL FACTORS: Time since onset of injury/illness/exacerbation and 1 comorbidity: palpitations w/ exercise  are also affecting patient's functional outcome.   REHAB POTENTIAL: Good  CLINICAL DECISION MAKING: Evolving/moderate complexity  EVALUATION COMPLEXITY: Low   GOALS: Goals reviewed with patient? No  SHORT TERM GOALS: Target date: 06/08/2022 Pt will demonstrate appropriate understanding and performance of initially prescribed HEP in order to facilitate improved independence with management of symptoms.  Baseline: HEP provided on eval 06/13/22: reports  excellent HEP adherence  Goal status: MET   2. Pt will score greater than or equal to 56 on FOTO in order to demonstrate improved perception of function due to symptoms.  Baseline: 49  06/13/22: to be assessed next visit (visit #6)   06/15/22: 59  Goal status: MET   LONG TERM GOALS: Target date: 07/20/2022 (updated 06/22/22)   Pt will score 64 or greater on FOTO in order to demonstrate improved perception of function due to symptoms.  Baseline: 49 06/22/22: 72  Goal status: MET  2.  Pt will demonstrate at least 12 degrees of ankle DF AROM in order to facilitate improved tolerance to functional movements such as squatting and walking up inclines. Baseline: see ROM chart above 06/22/22: 9 deg active, 10 deg passive, painless  Goal status: ONGOING  3.  Pt will endorse ability to drive for up to 30 min with less than 2pt increase in pain on NPS in order to facilitate improved tolerance to community navigation.  Baseline: increased pain with minimal driving 1/61/09: improving, not yet at target  Goal status: ONGOING  4.  Pt will  demonstrate ability to perform BIL heel raise and maintain for >15sec with less than 2 pt increase in pain in order to improve tolerance to laundry Baseline: top loading washer, pain/difficulty with loading due to heel raise per pt 06/20/22: 60 sec  Goal status: MET  5. Pt will demo/report ability to walk >45 min without seated rest, with less than 2pt increase on NPS in order to demo improved tolerance to recreational and work tasks.  Baseline: pain w/ >67min standing/walking  06/20/22: 30 minutes   Goal status: ONGOING   PLAN: updated 06/22/22  PT FREQUENCY: 1x/week  PT DURATION: 4 weeks  PLANNED INTERVENTIONS: Therapeutic exercises, Therapeutic activity, Neuromuscular re-education, Balance training, Gait training, Patient/Family education, Self Care, Joint mobilization, Joint manipulation, Stair training, Aquatic Therapy, Dry Needling, Cryotherapy, Moist heat, Taping, Vasopneumatic device, Manual therapy, and Re-evaluation  PLAN FOR NEXT SESSION: review/update HEP. Continue with loading program for achilles/post tib tendon, load management.    Ashley Murrain PT, DPT 06/22/2022 12:07 PM

## 2022-06-23 ENCOUNTER — Other Ambulatory Visit (HOSPITAL_COMMUNITY): Payer: Self-pay

## 2022-06-27 NOTE — Therapy (Unsigned)
OUTPATIENT PHYSICAL THERAPY PROGRESS NOTE + RECERTIFICATION   Patient Name: Brooke Sharp MRN: [id number] DOB:Jan 03, 1996, 27 y.o., female Today's Date: 06/27/2022   Progress Note Reporting Period 05/18/22 to 06/22/22  See note below for Objective Data and Assessment of Progress/Goals.      END OF SESSION:       Past Medical History:  Diagnosis Date   Adjustment disorder with mixed anxiety and depressed mood 02/25/2020   COVID 09/2018   weakness, sob muscle cramps x 2 weeks all symptoms resolved   History of palpitations in adulthood    summer 2021, wore heart monitor from pcp dr Gabriel Rung [facility], issue resolved   Wears glasses 06/24/2020   Past Surgical History:  Procedure Laterality Date   left shoulder surgery Left 2017   labral tear   WISDOM TOOTH EXTRACTION  2017   Patient Active Problem List   Diagnosis Date Noted   Adjustment disorder with mixed anxiety and depressed mood 02/25/2020    PCP: No PCP in chart  REFERRING PROVIDER: Lanell Persons, MD  REFERRING DIAG: S93.401D (ICD-10-CM) - Sprain of unspecified ligament of right ankle, subsequent encounter S93.601D (ICD-10-CM) - Unspecified sprain of right foot, subsequent encounter  THERAPY DIAG:  No diagnosis found.  Rationale for Evaluation and Treatment: Rehabilitation  ONSET DATE: End of March 2024  SUBJECTIVE:   SUBJECTIVE STATEMENT: 06/27/2022 No pain at present. Notes she is no longer having pain with every step. Able to tolerate standing >69min of standing without pain, mild increase that resolves with seated rest. Still needing breaks. Walking getting easier, feels like she is limping less and doing better with laundry. Still having difficulty with standing/walking situations where she can't have seated rest.    Per eval - Pt states in March she was carrying a linen bag that was fairly heavy, tripped over it and rolled her ankle. States she initially thought  pain would go away as she has rolled her ankles in the past, but it persisted into next day and so she went to see doctor. She states they gave her a surgical shoe, WBAT, was told that it was sprained. Encouraged to ice and rest. States she has had two follow up appts since. No longer wearing surgical shoe, now wearing ankle brace and remains WBAT. States lateral pain has gradually improved, but anterior and medial pain has improved "about 10%". States swelling has improved a great deal but does persist when she's up on her feet. Now icing PRN for swelling. No N/T. Tends to be pretty sore by end of work day  PERTINENT HISTORY: Anxiety/depression, palpitations with exercise   PAIN:  Are you having pain:  0/10 Location/description: lateral ankle, medial and anterior; variable pain tends to go from throbbing>stabbing  Best to worst past week: 0-   Per eval:  Best-worst over past week: 3-6/10  - aggravating factors: driving, standing/walking >15 min, laundry (top loading, has to get on tip toes), uneven ground and hills  - Easing factors: rest, ice,     PRECAUTIONS: fall hx, palpitations with exercise (received work up)  WEIGHT BEARING RESTRICTIONS: No  FALLS:  Has patient fallen in last 6 months? Yes. 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  PATIENT SURVEYS:  FOTO 49 current, 64 predicted 06/15/22 FOTO: 59  06/22/22 FOTO  72  COGNITION: Overall cognitive status: Within functional limits for tasks assessed     SENSATION: Denies sensory complaints  EDEMA:  Mild swelling medial/anterior ankle around joint line, formal measurement deferred  PALPATION: Significant TTP anterior aspect of ankle, deltoid ligament, posterior to medial malleolus. Tenderness with palpation of distal gastroc/soleus with referral into medial ankle. No overt bony tenderness but is fairly tender circumferentially around medial malleolus   LOWER EXTREMITY ROM:  Active ROM Right eval Left eval Right 06/20/22 Right 06/22/22 AROM   Knee flexion      Knee extension      Ankle dorsiflexion 8 deg mild pain * 12 deg 7 deg supine A: 9 deg long sitting no pain  P: 10 deg painless   Ankle plantarflexion  45 deg    Ankle inversion 30 deg * 38 deg    Ankle eversion 12 deg* 15 deg     (Blank rows = not tested) Comments: concordant pain in anterior/dorsal foot with passive hallux extension, none with hallux flexion  LOWER EXTREMITY MMT:  MMT Right eval Left eval  Knee flexion    Knee extension    Ankle dorsiflexion  5  Ankle plantarflexion (modified sitting)  5  Ankle inversion  5  Ankle eversion  5   (Blank rows = not tested) Comments: RLE NT on eval given symptom irritability  FUNCTIONAL TESTS:  5xSTS: standard chair, no UE support  GAIT: Distance walked: within clinic  Assistive device utilized: no AD, ASO R LE Level of assistance: Complete Independence Comments: mild antalgic gait on RLE, reduced gait speed/cadence   TODAY'S TREATMENT:   [facility] Adult PT Treatment:                                                DATE: 06/22/22 Therapeutic Exercise: GTB inversion 2x8 cues for pacing Post tib heel raises with ball at counter, 2x8 cues for pacing LLE marches on airex for RLE stability, 2x12 cues for form and weaning UE support as able HEP update/review  Education on relevant anatomy/physiology as it pertains to exercise in  session, HEP, and general exercise outside of sessions   Therapeutic Activity: MSK assessment + education FOTO + education Education/discussion re: progress with PT, symptom behavior as it affects activity tolerance, PT goals/POC  Education on relevant anatomy/physiology as it pertains to activity tolerance, HEP, and safe progression of activities based on symptom tolerance    PATIENT EDUCATION:  Education details: rationale for interventions, relevant anatomy/physiology, progress w/ PT thus far, safe progression of activities with regard to symptom behavior Person educated: Patient Education method: Explanation, Demonstration, Tactile cues, Verbal cues, and Handouts Education comprehension: verbalized understanding, returned demonstration, verbal cues required, tactile cues required, and needs further education    HOME EXERCISE PROGRAM: Access Code: [email or web address] URL: [email or web address] Date: 06/22/2022 Prepared by: Fransisco Hertz  Exercises - Towel Scrunches  - 1 x daily - 7 x weekly - 2-3 sets - 10 reps - Arch Lifting  -  1 x daily - 7 x weekly - 2-3 sets - 10 reps - Seated Figure 4 Ankle Inversion with Resistance  - 1 x daily - 7 x weekly - 3 sets - 10 reps - Heel Raises with Counter Support  - 1 x daily - 7 x weekly - 3 sets - 10 reps   ASSESSMENT:  CLINICAL IMPRESSION: 06/27/22 Pt arrives w/o pain, endorses notable improvement over past couple of weeks. Looking at LTGs below pt has made good progress, although she continues to endorse moderate pain when she is unable to take rest breaks. Pt discussion re: progress w/ PT thus far and her tolerance to daily activities/mobility, mutual decision made to update POC for 1x/week for 4 weeks as she is endorsing steadily improving progress with exercise/activity tolerance. Follows with MD next week. Pt endorses mild increase in symptoms as exercise goes on but notes improving compared to previous sessions, no adverse events and  departs w/ 3/10 pain. Recommend continuing along updated POC in order to address relevant deficits and improve functional tolerance. Pt departs today's session in no acute distress, all voiced questions/concerns addressed appropriately from PT perspective.      Per eval - Pt is a pleasant 27 year old woman who arrives to PT evaluation on this date for R ankle/foot pain after rolling her ankle at work in March 2023. Pt reports difficulty with prolonged standing, walking on even/uneven ground and inclines, and daily/work activities due to pain. During today's session pt demonstrates limitations in R ankle mobility in all planes (most concordant with inversion) which are limiting ability to perform aforementioned activities. MMT deferred given gradually increasing symptoms as exam goes on. Tolerates HEP well with education on appropriate home performance, no adverse events. Recommend skilled PT to address aforementioned deficits to improve functional independence/tolerance and progress back to work activities. Pt departs today's session in no acute distress, all voiced questions/concerns addressed appropriately from PT perspective.    OBJECTIVE IMPAIRMENTS: Abnormal gait, decreased activity tolerance, decreased balance, decreased endurance, decreased mobility, difficulty walking, decreased ROM, decreased strength, hypomobility, improper body mechanics, postural dysfunction, and pain.   ACTIVITY LIMITATIONS: carrying, lifting, standing, squatting, stairs, transfers, and locomotion level  PARTICIPATION LIMITATIONS: meal prep, cleaning, laundry, driving, community activity, and occupation   PERSONAL FACTORS: Time since onset of injury/illness/exacerbation and 1 comorbidity: palpitations w/ exercise  are also affecting patient's functional outcome.   REHAB POTENTIAL: Good  CLINICAL DECISION MAKING: Evolving/moderate complexity  EVALUATION COMPLEXITY: Low   GOALS: Goals reviewed with patient?  No  SHORT TERM GOALS: Target date: 06/08/2022 Pt will demonstrate appropriate understanding and performance of initially prescribed HEP in order to facilitate improved independence with management of symptoms.  Baseline: HEP provided on eval 06/13/22: reports excellent HEP adherence  Goal status: MET   2. Pt will score greater than or equal to 56 on FOTO in order to demonstrate improved perception of function due to symptoms.  Baseline: 49  06/13/22: to be assessed next visit (visit #6)   06/15/22: 59  Goal status: MET   LONG TERM GOALS: Target date: 07/20/2022 (updated 06/22/22)   Pt will score 64 or greater on FOTO in order to demonstrate improved perception of function due to symptoms.  Baseline: 49 06/22/22: 72  Goal status: MET  2.  Pt will demonstrate at least 12 degrees of ankle DF AROM in order to facilitate improved tolerance to functional movements such as squatting and walking up inclines. Baseline: see ROM chart above 06/22/22: 9 deg active,  10 deg passive, painless  Goal status: ONGOING  3.  Pt will endorse ability to drive for up to 30 min with less than 2pt increase in pain on NPS in order to facilitate improved tolerance to community navigation.  Baseline: increased pain with minimal driving 04/18/79: improving, not yet at target  Goal status: ONGOING  4.  Pt will demonstrate ability to perform BIL heel raise and maintain for >15sec with less than 2 pt increase in pain in order to improve tolerance to laundry Baseline: top loading washer, pain/difficulty with loading due to heel raise per pt 06/20/22: 60 sec  Goal status: MET  5. Pt will demo/report ability to walk >45 min without seated rest, with less than 2pt increase on NPS in order to demo improved tolerance to recreational and work tasks.  Baseline: pain w/ >5min standing/walking  06/20/22: 30 minutes   Goal status: ONGOING   PLAN: updated 06/22/22  PT FREQUENCY: 1x/week  PT DURATION: 4 weeks  PLANNED  INTERVENTIONS: Therapeutic exercises, Therapeutic activity, Neuromuscular re-education, Balance training, Gait training, Patient/Family education, Self Care, Joint mobilization, Joint manipulation, Stair training, Aquatic Therapy, Dry Needling, Cryotherapy, Moist heat, Taping, Vasopneumatic device, Manual therapy, and Re-evaluation  PLAN FOR NEXT SESSION: review/update HEP. Continue with loading program for achilles/post tib tendon, load management.    Ashley Murrain PT, DPT 06/27/2022 3:56 PM

## 2022-06-27 NOTE — Therapy (Unsigned)
OUTPATIENT PHYSICAL THERAPY PROGRESS NOTE + RECERTIFICATION   Patient Name: Brooke Sharp MRN: [id number] DOB:02/15/1995, 27 y.o., female Today's Date: 06/22/2022   Progress Note Reporting Period 05/18/22 to 06/22/22  See note below for Objective Data and Assessment of Progress/Goals.      END OF SESSION:  PT End of Session - 06/22/22 1100     Visit Number 8    Number of Visits 12    Date for PT Re-Evaluation 07/20/22    Authorization Type worker's comp, [place]    Authorization - Visit Number 8    Authorization - Number of Visits 8    PT Start Time 1101    PT Stop Time 1148    PT Time Calculation (min) 47 min    Activity Tolerance Patient tolerated treatment well    Behavior During Therapy WFL for tasks assessed/performed                Past Medical History:  Diagnosis Date   Adjustment disorder with mixed anxiety and depressed mood 02/25/2020   COVID 09/2018   weakness, sob muscle cramps x 2 weeks all symptoms resolved   History of palpitations in adulthood    summer 2021, wore heart monitor from pcp dr [facility], issue resolved   Wears glasses 06/24/2020   Past Surgical History:  Procedure Laterality Date   left shoulder surgery Left 2017   labral tear   WISDOM TOOTH EXTRACTION  2017   Patient Active Problem List   Diagnosis Date Noted   Adjustment disorder with mixed anxiety and depressed mood 02/25/2020    PCP: No PCP in chart  REFERRING PROVIDER: Kingsley, Thomas, MD  REFERRING DIAG: S93.401D (ICD-10-CM) - Sprain of unspecified ligament of right ankle, subsequent encounter S93.601D (ICD-10-CM) - Unspecified sprain of right foot, subsequent encounter  THERAPY DIAG:  Pain in right ankle and joints of right foot  Stiffness of right ankle, not elsewhere classified  Other abnormalities of gait and mobility  Localized edema  Rationale for Evaluation and Treatment: Rehabilitation  ONSET DATE: End of  March 2024  SUBJECTIVE:   SUBJECTIVE STATEMENT: 06/22/2022 No pain at present. Notes she is no longer having pain with every step. Able to tolerate standing >30min of standing without pain, mild increase that resolves with seated rest. Still needing breaks. Walking getting easier, feels like she is limping less and doing better with laundry. Still having difficulty with standing/walking situations where she can't have seated rest.    Per eval - Pt states in March she was carrying a linen bag that was fairly heavy, tripped over it and rolled her ankle. States she initially thought pain would go away as she has rolled her ankles in the past, but it persisted into next day and so she went to see doctor. She states they gave her a surgical shoe, WBAT, was told that it was sprained. Encouraged to ice and rest. States she has had two follow up appts since. No longer wearing surgical shoe, now wearing ankle brace and remains WBAT. States lateral pain has gradually improved, but anterior and medial pain has improved "about 10%". States swelling has improved a great deal but does persist when she's up on her feet. Now icing PRN for swelling. No N/T. Tends to be pretty sore by end of work day  PERTINENT HISTORY: Anxiety/depression, palpitations with exercise   PAIN:  Are you having pain:  0/10 Location/description: lateral ankle, medial and anterior; variable pain tends to   go from throbbing>stabbing  Best to worst past week: 0-   Per eval:  Best-worst over past week: 3-6/10  - aggravating factors: driving, standing/walking >15 min, laundry (top loading, has to get on tip toes), uneven ground and hills  - Easing factors: rest, ice,     PRECAUTIONS: fall hx, palpitations with exercise (received work up)  WEIGHT BEARING RESTRICTIONS: No  FALLS:  Has patient fallen in last 6 months? Yes. 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  PATIENT SURVEYS:  FOTO 49 current, 64 predicted 06/15/22 FOTO: 59  06/22/22 FOTO 72  COGNITION: Overall cognitive status: Within functional limits for tasks assessed     SENSATION: Denies sensory complaints  EDEMA:  Mild swelling medial/anterior ankle around joint line, formal measurement deferred  PALPATION: Significant TTP anterior aspect of ankle, deltoid ligament, posterior to medial malleolus. Tenderness with palpation of distal gastroc/soleus with referral into medial ankle. No overt bony tenderness but is fairly tender circumferentially around medial malleolus   LOWER EXTREMITY ROM:  Active ROM Right eval Left eval Right 06/20/22 Right 06/22/22 AROM   Knee flexion      Knee extension      Ankle dorsiflexion 8 deg mild pain * 12 deg 7 deg supine A: 9 deg long sitting no pain  P: 10 deg painless   Ankle plantarflexion  45 deg    Ankle inversion 30 deg * 38 deg    Ankle eversion 12 deg* 15 deg     (Blank rows = not tested) Comments: concordant pain in anterior/dorsal foot with passive hallux extension, none with hallux flexion  LOWER EXTREMITY MMT:  MMT Right eval Left eval  Knee flexion    Knee extension    Ankle dorsiflexion  5  Ankle plantarflexion (modified sitting)  5  Ankle inversion  5  Ankle eversion  5   (Blank rows = not tested) Comments: RLE NT on eval given symptom irritability  FUNCTIONAL TESTS:  5xSTS: standard chair, no UE  support  GAIT: Distance walked: within clinic  Assistive device utilized: no AD, ASO R LE Level of assistance: Complete Independence Comments: mild antalgic gait on RLE, reduced gait speed/cadence   TODAY'S TREATMENT:   [facility] Adult PT Treatment:                                                DATE: 06/22/22 Therapeutic Exercise: GTB inversion 2x8 cues for pacing Post tib heel raises with ball at counter, 2x8 cues for pacing LLE marches on airex for RLE stability, 2x12 cues for form and weaning UE support as able HEP update/review  Education on relevant anatomy/physiology as it pertains to exercise in session, HEP, and general exercise outside of sessions   Therapeutic Activity: MSK assessment + education FOTO + education Education/discussion re:   progress with PT, symptom behavior as it affects activity tolerance, PT goals/POC  Education on relevant anatomy/physiology as it pertains to activity tolerance, HEP, and safe progression of activities based on symptom tolerance    PATIENT EDUCATION:  Education details: rationale for interventions, relevant anatomy/physiology, progress w/ PT thus far, safe progression of activities with regard to symptom behavior Person educated: Patient Education method: Explanation, Demonstration, Tactile cues, Verbal cues, and Handouts Education comprehension: verbalized understanding, returned demonstration, verbal cues required, tactile cues required, and needs further education    HOME EXERCISE PROGRAM: Access Code: [email or web address] URL: [email or web address] Date: 06/22/2022 Prepared by: David Guthrie  Exercises - Towel Scrunches  - 1 x daily - 7 x weekly - 2-3 sets - 10 reps - Arch Lifting  - 1 x daily - 7 x weekly - 2-3 sets - 10 reps - Seated Figure 4 Ankle Inversion with Resistance  - 1 x daily - 7 x weekly - 3 sets - 10 reps - Heel Raises with Counter Support  - 1 x daily - 7 x weekly - 3 sets - 10 reps   ASSESSMENT:  CLINICAL  IMPRESSION: 06/22/22 Pt arrives w/o pain, endorses notable improvement over past couple of weeks. Looking at LTGs below pt has made good progress, although she continues to endorse moderate pain when she is unable to take rest breaks. Pt discussion re: progress w/ PT thus far and her tolerance to daily activities/mobility, mutual decision made to update POC for 1x/week for 4 weeks as she is endorsing steadily improving progress with exercise/activity tolerance. Follows with MD next week. Pt endorses mild increase in symptoms as exercise goes on but notes improving compared to previous sessions, no adverse events and departs w/ 3/10 pain. Recommend continuing along updated POC in order to address relevant deficits and improve functional tolerance. Pt departs today's session in no acute distress, all voiced questions/concerns addressed appropriately from PT perspective.      Per eval - Pt is a pleasant 26 year old woman who arrives to PT evaluation on this date for R ankle/foot pain after rolling her ankle at work in March 2023. Pt reports difficulty with prolonged standing, walking on even/uneven ground and inclines, and daily/work activities due to pain. During today's session pt demonstrates limitations in R ankle mobility in all planes (most concordant with inversion) which are limiting ability to perform aforementioned activities. MMT deferred given gradually increasing symptoms as exam goes on. Tolerates HEP well with education on appropriate home performance, no adverse events. Recommend skilled PT to address aforementioned deficits to improve functional independence/tolerance and progress back to work activities. Pt departs today's session in no acute distress, all voiced questions/concerns addressed appropriately from PT perspective.    OBJECTIVE IMPAIRMENTS: Abnormal gait, decreased activity tolerance, decreased balance, decreased endurance, decreased mobility, difficulty walking, decreased ROM,  decreased strength, hypomobility, improper body mechanics, postural dysfunction, and pain.   ACTIVITY LIMITATIONS: carrying, lifting, standing, squatting, stairs, transfers, and locomotion level  PARTICIPATION LIMITATIONS: meal prep, cleaning, laundry, driving, community activity, and occupation   PERSONAL FACTORS: Time since onset of injury/illness/exacerbation and 1 comorbidity: palpitations w/ exercise  are also affecting patient's functional outcome.   REHAB POTENTIAL: Good  CLINICAL DECISION MAKING: Evolving/moderate complexity  EVALUATION COMPLEXITY: Low   GOALS: Goals reviewed with patient? No  SHORT TERM GOALS: Target date: 06/08/2022 Pt will demonstrate appropriate understanding and performance of initially prescribed HEP in order to facilitate improved independence with management of symptoms.  Baseline: HEP provided on eval 06/13/22: reports   excellent HEP adherence  Goal status: MET   2. Pt will score greater than or equal to 56 on FOTO in order to demonstrate improved perception of function due to symptoms.  Baseline: 49  06/13/22: to be assessed next visit (visit #6)   06/15/22: 59  Goal status: MET   LONG TERM GOALS: Target date: 07/20/2022 (updated 06/22/22)   Pt will score 64 or greater on FOTO in order to demonstrate improved perception of function due to symptoms.  Baseline: 49 06/22/22: 72  Goal status: MET  2.  Pt will demonstrate at least 12 degrees of ankle DF AROM in order to facilitate improved tolerance to functional movements such as squatting and walking up inclines. Baseline: see ROM chart above 06/22/22: 9 deg active, 10 deg passive, painless  Goal status: ONGOING  3.  Pt will endorse ability to drive for up to 30 min with less than 2pt increase in pain on NPS in order to facilitate improved tolerance to community navigation.  Baseline: increased pain with minimal driving 06/20/22: improving, not yet at target  Goal status: ONGOING  4.  Pt will  demonstrate ability to perform BIL heel raise and maintain for >15sec with less than 2 pt increase in pain in order to improve tolerance to laundry Baseline: top loading washer, pain/difficulty with loading due to heel raise per pt 06/20/22: 60 sec  Goal status: MET  5. Pt will demo/report ability to walk >45 min without seated rest, with less than 2pt increase on NPS in order to demo improved tolerance to recreational and work tasks.  Baseline: pain w/ >15min standing/walking  06/20/22: 30 minutes   Goal status: ONGOING   PLAN: updated 06/22/22  PT FREQUENCY: 1x/week  PT DURATION: 4 weeks  PLANNED INTERVENTIONS: Therapeutic exercises, Therapeutic activity, Neuromuscular re-education, Balance training, Gait training, Patient/Family education, Self Care, Joint mobilization, Joint manipulation, Stair training, Aquatic Therapy, Dry Needling, Cryotherapy, Moist heat, Taping, Vasopneumatic device, Manual therapy, and Re-evaluation  PLAN FOR NEXT SESSION: review/update HEP. Continue with loading program for achilles/post tib tendon, load management.    David A Guthrie PT, DPT 06/22/2022 12:07 PM  

## 2022-06-28 ENCOUNTER — Ambulatory Visit: Payer: PRIVATE HEALTH INSURANCE | Attending: Family Medicine

## 2022-06-28 ENCOUNTER — Other Ambulatory Visit (HOSPITAL_COMMUNITY): Payer: Self-pay

## 2022-06-28 DIAGNOSIS — R2689 Other abnormalities of gait and mobility: Secondary | ICD-10-CM | POA: Diagnosis present

## 2022-06-28 DIAGNOSIS — M25571 Pain in right ankle and joints of right foot: Secondary | ICD-10-CM | POA: Diagnosis present

## 2022-06-28 DIAGNOSIS — M25671 Stiffness of right ankle, not elsewhere classified: Secondary | ICD-10-CM | POA: Diagnosis present

## 2022-06-28 MED ORDER — METHYLPREDNISOLONE 4 MG PO TABS
8.0000 mg | ORAL_TABLET | Freq: Two times a day (BID) | ORAL | 1 refills | Status: DC
  Filled 2022-06-28: qty 16, 4d supply, fill #0
  Filled 2022-10-31: qty 16, 4d supply, fill #1

## 2022-06-28 MED ORDER — MENOPUR 75 UNITS ~~LOC~~ SOLR
75.0000 [IU] | Freq: Every day | SUBCUTANEOUS | 1 refills | Status: DC
  Filled 2022-06-28: qty 10, 10d supply, fill #0
  Filled 2022-07-18: qty 10, 10d supply, fill #1
  Filled 2022-07-18: qty 10, 10d supply, fill #0

## 2022-06-28 MED ORDER — GONAL-F RFF REDIJECT 900 UNIT/1.5ML ~~LOC~~ SOPN
300.0000 [IU] | PEN_INJECTOR | Freq: Every day | SUBCUTANEOUS | 1 refills | Status: DC
  Filled 2022-06-28: qty 6, 4d supply, fill #0
  Filled 2022-06-29: qty 6, 18d supply, fill #0
  Filled 2022-07-17: qty 1.5, 5d supply, fill #1

## 2022-06-28 MED ORDER — "BD DISP NEEDLES 27G X 1/2"" MISC"
3 refills | Status: DC
  Filled 2022-06-28: qty 30, 15d supply, fill #0
  Filled 2022-10-31: qty 30, 15d supply, fill #1
  Filled 2022-11-23: qty 30, 15d supply, fill #2

## 2022-06-28 MED ORDER — NOVAREL 5000 UNITS IM SOLR
INTRAMUSCULAR | 0 refills | Status: DC
  Filled 2022-06-28: qty 1, 1d supply, fill #0

## 2022-06-28 MED ORDER — "BD LUER-LOK SYRINGE 18G X 1-1/2"" 3 ML MISC"
3 refills | Status: DC
  Filled 2022-06-28: qty 60, 30d supply, fill #0
  Filled 2022-10-31: qty 60, 30d supply, fill #1
  Filled 2022-12-01: qty 60, 30d supply, fill #2

## 2022-06-28 MED ORDER — DOXYCYCLINE HYCLATE 100 MG PO TABS
100.0000 mg | ORAL_TABLET | Freq: Two times a day (BID) | ORAL | 0 refills | Status: DC
  Filled 2022-06-28: qty 40, 20d supply, fill #0

## 2022-06-28 MED ORDER — ESTRADIOL 2 MG PO TABS
2.0000 mg | ORAL_TABLET | Freq: Two times a day (BID) | ORAL | 3 refills | Status: DC
  Filled 2022-06-28: qty 60, 30d supply, fill #0
  Filled 2022-09-05: qty 60, 30d supply, fill #1
  Filled 2022-10-18: qty 60, 30d supply, fill #2
  Filled 2022-11-23: qty 60, 30d supply, fill #3

## 2022-06-28 MED ORDER — CETRORELIX ACETATE 0.25 MG ~~LOC~~ KIT
PACK | SUBCUTANEOUS | 1 refills | Status: DC
  Filled 2022-06-28: qty 5, 5d supply, fill #0
  Filled 2022-07-17: qty 4, 4d supply, fill #1

## 2022-06-28 MED ORDER — ESTRADIOL 0.1 MG/24HR TD PTTW
1.0000 | MEDICATED_PATCH | TRANSDERMAL | 3 refills | Status: DC
  Filled 2022-06-28: qty 8, 28d supply, fill #0
  Filled 2022-09-05: qty 8, 28d supply, fill #1
  Filled 2022-10-18: qty 8, 28d supply, fill #2
  Filled 2022-10-31 – 2022-11-11 (×2): qty 8, 28d supply, fill #3

## 2022-06-28 MED ORDER — "EASY TOUCH HYPODERMIC NEEDLE 22G X 1-1/2"" MISC"
3 refills | Status: DC
  Filled 2022-06-28: qty 30, 15d supply, fill #0
  Filled 2022-10-31: qty 30, 15d supply, fill #1
  Filled 2022-11-23 – 2022-12-01 (×2): qty 30, 15d supply, fill #2

## 2022-06-28 MED ORDER — PROGESTERONE 50 MG/ML IM OIL
50.0000 mg | TOPICAL_OIL | Freq: Every day | INTRAMUSCULAR | 3 refills | Status: DC
  Filled 2022-06-28: qty 30, 30d supply, fill #0
  Filled 2022-10-31: qty 30, 30d supply, fill #1
  Filled 2022-11-23 (×3): qty 30, 30d supply, fill #2
  Filled 2022-11-24: qty 10, 10d supply, fill #0
  Filled 2022-11-24: qty 10, 10d supply, fill #2
  Filled 2022-11-24: qty 30, 30d supply, fill #2
  Filled 2022-12-05: qty 10, 10d supply, fill #1

## 2022-06-29 ENCOUNTER — Other Ambulatory Visit (HOSPITAL_COMMUNITY): Payer: Self-pay

## 2022-06-30 ENCOUNTER — Other Ambulatory Visit (HOSPITAL_COMMUNITY): Payer: Self-pay

## 2022-07-04 ENCOUNTER — Other Ambulatory Visit (HOSPITAL_COMMUNITY): Payer: Self-pay

## 2022-07-04 DIAGNOSIS — Z3183 Encounter for assisted reproductive fertility procedure cycle: Secondary | ICD-10-CM | POA: Diagnosis not present

## 2022-07-07 ENCOUNTER — Other Ambulatory Visit (HOSPITAL_COMMUNITY): Payer: Self-pay

## 2022-07-07 DIAGNOSIS — N979 Female infertility, unspecified: Secondary | ICD-10-CM | POA: Diagnosis not present

## 2022-07-07 DIAGNOSIS — Z3183 Encounter for assisted reproductive fertility procedure cycle: Secondary | ICD-10-CM | POA: Diagnosis not present

## 2022-07-07 MED ORDER — LUPRON DEPOT (1-MONTH) 3.75 MG IM KIT
PACK | INTRAMUSCULAR | 0 refills | Status: DC
  Filled 2022-07-07: qty 1, 1d supply, fill #0

## 2022-07-10 ENCOUNTER — Other Ambulatory Visit (HOSPITAL_COMMUNITY): Payer: Self-pay

## 2022-07-10 DIAGNOSIS — Z3183 Encounter for assisted reproductive fertility procedure cycle: Secondary | ICD-10-CM | POA: Diagnosis not present

## 2022-07-10 NOTE — Therapy (Addendum)
OUTPATIENT PHYSICAL THERAPY TREATMENT NOTE + NO VISIT DISCHARGE SUMMARY (see below)    Patient Name: Brooke Sharp MRN: [id number] DOB:11-Jun-1995, 27 y.o., female Today's Date: 07/11/2022       END OF SESSION:  PT End of Session - 07/11/22 0807     Visit Number 10    Number of Visits 11    Date for PT Re-Evaluation 07/20/22    Authorization Type worker's comp, Erin    Authorization - Visit Number 10    Authorization - Number of Visits 11    PT Start Time [phone]   late check in   PT Stop Time 0848    PT Time Calculation (min) 41 min    Activity Tolerance Patient tolerated treatment well    Behavior During Therapy [facility] for tasks assessed/performed                  Past Medical History:  Diagnosis Date   Adjustment disorder with mixed anxiety and depressed mood 02/25/2020   COVID 09/2018   weakness, sob muscle cramps x 2 weeks all symptoms resolved   History of palpitations in adulthood    summer 2021, wore heart monitor from pcp dr Gabriel Rung [facility], issue resolved   Wears glasses 06/24/2020   Past Surgical History:  Procedure Laterality Date   left shoulder surgery Left 2017   labral tear   WISDOM TOOTH EXTRACTION  2017   Patient Active Problem List   Diagnosis Date Noted   Adjustment disorder with mixed anxiety and depressed mood 02/25/2020    PCP: No PCP in chart  REFERRING PROVIDER: Lanell Persons, MD  REFERRING DIAG: [id number] (ICD-10-CM) - Sprain of unspecified ligament of right ankle, subsequent encounter S93.601D (ICD-10-CM) - Unspecified sprain of right foot, subsequent encounter  THERAPY DIAG:  Pain in right ankle and joints of right foot  Stiffness of right ankle, not elsewhere classified  Other abnormalities of gait and mobility  Localized edema  Rationale for Evaluation and Treatment: Rehabilitation  ONSET DATE: End of March 2024  SUBJECTIVE:   SUBJECTIVE STATEMENT:  07/11/2022 states she  has been back at work with typical duties but rest breaks PRN. States she has do a bit more icing but not really having much swelling. States that tolerance continues to slowly improve, no pain at present. HEP going well, still having soreness for about a day after PT sessions     Per eval - Pt states in March she was carrying a linen bag that was fairly heavy, tripped over it and rolled her ankle. States she initially thought pain would go away as she has rolled her ankles in the past, but it persisted into next day and so she went to see doctor. She states they gave her a surgical shoe, WBAT, was told that it was sprained. Encouraged to ice and rest. States she has had two follow up appts since. No longer wearing surgical shoe, now wearing ankle brace and remains WBAT. States lateral pain has gradually improved, but anterior and medial pain has improved "about 10%". States swelling has improved a great deal but does persist when she's up on her feet. Now icing PRN for swelling. No N/T. Tends to be pretty sore by end of work day  PERTINENT HISTORY: Anxiety/depression, palpitations with exercise   PAIN:  Are you having pain:  0/10 Location/description: lateral ankle, medial and anterior; variable pain tends to go from throbbing>stabbing  Best to worst past week: 0-  Per eval:  Best-worst over past week: 3-6/10  - aggravating factors: driving, standing/walking >15 min, laundry (top loading, has to get on tip toes), uneven ground and hills  - Easing factors: rest, ice,     PRECAUTIONS: fall hx, palpitations with exercise (received work up)  WEIGHT BEARING RESTRICTIONS: No  FALLS:  Has patient fallen in last 6 months? Yes. 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  PATIENT SURVEYS:  FOTO 49 current, 64 predicted 06/15/22 FOTO: 59  06/22/22 FOTO 72  COGNITION: Overall cognitive status: Within functional limits for tasks assessed     SENSATION: Denies sensory complaints  EDEMA:  Mild swelling medial/anterior ankle around joint line, formal measurement deferred  PALPATION: Significant TTP anterior aspect of ankle, deltoid ligament, posterior to medial malleolus. Tenderness with palpation of distal gastroc/soleus with referral into medial ankle. No overt bony tenderness but is fairly tender circumferentially around medial malleolus   LOWER EXTREMITY ROM:  Active ROM Right eval Left eval Right 06/20/22 Right 06/22/22 AROM   Knee flexion      Knee extension      Ankle dorsiflexion 8 deg mild pain * 12 deg 7 deg supine A: 9 deg long sitting no pain  P: 10 deg painless   Ankle plantarflexion  45 deg    Ankle inversion 30 deg * 38 deg    Ankle eversion 12 deg* 15 deg     (Blank rows = not tested) Comments: concordant pain in anterior/dorsal foot with passive hallux extension, none with hallux flexion  LOWER EXTREMITY MMT:  MMT Right eval Left eval  Knee flexion    Knee extension    Ankle dorsiflexion  5  Ankle plantarflexion (modified sitting)  5  Ankle inversion  5  Ankle eversion  5   (Blank rows = not tested) Comments: RLE NT on eval given symptom irritability  FUNCTIONAL TESTS:  5xSTS: standard chair, no UE support  GAIT: Distance walked: within clinic  Assistive device utilized: no AD, ASO R LE Level of assistance: Complete Independence Comments: mild antalgic gait on RLE, reduced gait  speed/cadence   TODAY'S TREATMENT:   [facility] Adult PT Treatment:                                                DATE: 07/11/22 Therapeutic Exercise: Heel raises x15 BW w UE support  Post tib heel raises w UE support 2x10 Dynadisc step up RLE 2x15 CGA for safety Seated heel raise (using knee ext machine) 20# BIL 2x12 Seated inversion blue band 2x5 cues for setup HEPupdate + education and handout Continued education on rationale for interventions, basic exercise anatomy/physiology as it pertains to HEP and self progression for increased independence   [facility] Adult PT Treatment:  DATE: 06/28/22 Therapeutic Exercise: PF stretch 30s x3 R inversion 3x10 RTB R great toe plantar flexion RTB 3x10 Post tib heel raises with ball at wall for soft tissue stretch, 2x10 LLE marches on airex for RLE stability, 10/10 cues for form and weaning UE support as able Con/ecc heel raises from 4 in block Manual Therapy: Subtalar mobs into eversion/anterior draw  [facility] Adult PT Treatment:                                                DATE: 06/22/22 Therapeutic Exercise: GTB inversion 2x8 cues for pacing Post tib heel raises with ball at counter, 2x8 cues for pacing LLE marches on airex for RLE stability, 2x12 cues for form and weaning UE support as able HEP update/review  Education on relevant anatomy/physiology as it pertains to exercise in session, HEP, and general exercise outside of sessions   Therapeutic Activity: MSK assessment + education FOTO + education Education/discussion re: progress with PT, symptom behavior as it affects activity tolerance, PT goals/POC  Education on relevant anatomy/physiology as it pertains to activity tolerance, HEP, and safe progression of activities based on symptom tolerance    PATIENT EDUCATION:  Education details: rationale for interventions, HEP  Person educated: Patient Education method: Explanation, Demonstration,  Tactile cues, Verbal cues, and Handouts Education comprehension: verbalized understanding, returned demonstration, verbal cues required, tactile cues required, and needs further education    HOME EXERCISE PROGRAM: Access Code: [email or web address] URL: [email or web address] Date: 07/11/2022 Prepared by: Fransisco Hertz  Program Notes alternate days with heel raises no ball, heel raises with ball  Exercises - Towel Scrunches  - 1 x daily - 7 x weekly - 2-3 sets - 10 reps - Arch Lifting  - 1 x daily - 7 x weekly - 2-3 sets - 10 reps - Seated Figure 4 Ankle Inversion with Resistance  - 1 x daily - 7 x weekly - 3 sets - 10 reps - Heel Raises with Counter Support  - 1 x daily - 7 x weekly - 3 sets - 10 reps - Standing Calf Raise With Small Ball at Heels  - 1 x daily - 7 x weekly - 3 sets - 10 reps   ASSESSMENT:  CLINICAL IMPRESSION:  07/11/2022 Pt arrives w/o pain, continues to endorse steadily improving tolerance. Today continuing to progress closed chain ankle strengthening with emphasis on post tib. Tolerates well with report of fatigue, mild increase in pain (3/10) on departure but pt notes her tolerance continues to feel better each session. Recommend looking at goals/progress next session to assess appropriateness of discharge vs extension of POC. No adverse events. Pt departs today's session in no acute distress, all voiced questions/concerns addressed appropriately from PT perspective.     Per eval - Pt is a pleasant 27 year old woman who arrives to PT evaluation on this date for R ankle/foot pain after rolling her ankle at work in March 2023. Pt reports difficulty with prolonged standing, walking on even/uneven ground and inclines, and daily/work activities due to pain. During today's session pt demonstrates limitations in R ankle mobility in all planes (most concordant with inversion) which are limiting ability to perform aforementioned activities. MMT deferred given gradually increasing  symptoms as exam goes on. Tolerates HEP well with education on appropriate home performance, no adverse events. Recommend skilled PT to address aforementioned deficits to  improve functional independence/tolerance and progress back to work activities. Pt departs today's session in no acute distress, all voiced questions/concerns addressed appropriately from PT perspective.    OBJECTIVE IMPAIRMENTS: Abnormal gait, decreased activity tolerance, decreased balance, decreased endurance, decreased mobility, difficulty walking, decreased ROM, decreased strength, hypomobility, improper body mechanics, postural dysfunction, and pain.   ACTIVITY LIMITATIONS: carrying, lifting, standing, squatting, stairs, transfers, and locomotion level  PARTICIPATION LIMITATIONS: meal prep, cleaning, laundry, driving, community activity, and occupation   PERSONAL FACTORS: Time since onset of injury/illness/exacerbation and 1 comorbidity: palpitations w/ exercise  are also affecting patient's functional outcome.   REHAB POTENTIAL: Good  CLINICAL DECISION MAKING: Evolving/moderate complexity  EVALUATION COMPLEXITY: Low   GOALS: Goals reviewed with patient? No  SHORT TERM GOALS: Target date: 06/08/2022 Pt will demonstrate appropriate understanding and performance of initially prescribed HEP in order to facilitate improved independence with management of symptoms.  Baseline: HEP provided on eval 06/13/22: reports excellent HEP adherence  Goal status: MET   2. Pt will score greater than or equal to 56 on FOTO in order to demonstrate improved perception of function due to symptoms.  Baseline: 49  06/13/22: to be assessed next visit (visit #6)   06/15/22: 59  Goal status: MET   LONG TERM GOALS: Target date: 07/20/2022 (updated 06/22/22)   Pt will score 64 or greater on FOTO in order to demonstrate improved perception of function due to symptoms.  Baseline: 49 06/22/22: 72  Goal status: MET  2.  Pt will demonstrate at  least 12 degrees of ankle DF AROM in order to facilitate improved tolerance to functional movements such as squatting and walking up inclines. Baseline: see ROM chart above 06/22/22: 9 deg active, 10 deg passive, painless  Goal status: ONGOING  3.  Pt will endorse ability to drive for up to 30 min with less than 2pt increase in pain on NPS in order to facilitate improved tolerance to community navigation.  Baseline: increased pain with minimal driving 06/06/39: improving, not yet at target  Goal status: ONGOING  4.  Pt will demonstrate ability to perform BIL heel raise and maintain for >15sec with less than 2 pt increase in pain in order to improve tolerance to laundry Baseline: top loading washer, pain/difficulty with loading due to heel raise per pt 06/20/22: 60 sec  Goal status: MET  5. Pt will demo/report ability to walk >45 min without seated rest, with less than 2pt increase on NPS in order to demo improved tolerance to recreational and work tasks.  Baseline: pain w/ >52min standing/walking  06/20/22: 30 minutes   Goal status: ONGOING   PLAN: updated 06/22/22  PT FREQUENCY: 1x/week  PT DURATION: 4 weeks  PLANNED INTERVENTIONS: Therapeutic exercises, Therapeutic activity, Neuromuscular re-education, Balance training, Gait training, Patient/Family education, Self Care, Joint mobilization, Joint manipulation, Stair training, Aquatic Therapy, Dry Needling, Cryotherapy, Moist heat, Taping, Vasopneumatic device, Manual therapy, and Re-evaluation  PLAN FOR NEXT SESSION: review/update HEP. Continue with loading program for achilles/post tib tendon, load management. Look at goals.     Ashley Murrain PT, DPT 07/11/2022 9:47 AM    Discharge addendum:     PHYSICAL THERAPY DISCHARGE SUMMARY  Visits from Start of Care: 10  Current functional level related to goals / functional outcomes: Unable to assess   Remaining deficits: Unable to assess   Education / Equipment: Unable to  assess  Patient goals were  unable to be assessed . Patient is being discharged due to not returning since the last  visit.   Ashley Murrain PT, DPT 09/14/2022 2:42 PM

## 2022-07-11 ENCOUNTER — Ambulatory Visit: Payer: 59 | Attending: Family Medicine | Admitting: Physical Therapy

## 2022-07-11 ENCOUNTER — Encounter: Payer: Self-pay | Admitting: Physical Therapy

## 2022-07-11 DIAGNOSIS — M25571 Pain in right ankle and joints of right foot: Secondary | ICD-10-CM | POA: Insufficient documentation

## 2022-07-11 DIAGNOSIS — R600 Localized edema: Secondary | ICD-10-CM | POA: Insufficient documentation

## 2022-07-11 DIAGNOSIS — M25671 Stiffness of right ankle, not elsewhere classified: Secondary | ICD-10-CM | POA: Insufficient documentation

## 2022-07-11 DIAGNOSIS — R2689 Other abnormalities of gait and mobility: Secondary | ICD-10-CM | POA: Insufficient documentation

## 2022-07-17 ENCOUNTER — Other Ambulatory Visit (HOSPITAL_COMMUNITY): Payer: Self-pay

## 2022-07-17 DIAGNOSIS — Z31430 Encounter of female for testing for genetic disease carrier status for procreative management: Secondary | ICD-10-CM | POA: Diagnosis not present

## 2022-07-17 DIAGNOSIS — N96 Recurrent pregnancy loss: Secondary | ICD-10-CM | POA: Diagnosis not present

## 2022-07-17 DIAGNOSIS — Z319 Encounter for procreative management, unspecified: Secondary | ICD-10-CM | POA: Diagnosis not present

## 2022-07-17 DIAGNOSIS — Z3183 Encounter for assisted reproductive fertility procedure cycle: Secondary | ICD-10-CM | POA: Diagnosis not present

## 2022-07-17 DIAGNOSIS — Z113 Encounter for screening for infections with a predominantly sexual mode of transmission: Secondary | ICD-10-CM | POA: Diagnosis not present

## 2022-07-18 ENCOUNTER — Other Ambulatory Visit (HOSPITAL_COMMUNITY): Payer: Self-pay

## 2022-07-18 ENCOUNTER — Ambulatory Visit: Payer: PRIVATE HEALTH INSURANCE | Attending: Family Medicine | Admitting: Physical Therapy

## 2022-07-18 ENCOUNTER — Telehealth: Payer: Self-pay | Admitting: Physical Therapy

## 2022-07-18 NOTE — Telephone Encounter (Signed)
Called pt re: this morning's missed appt - left voicemail with date/time of next appt and office call back number, encouraged to reach out with any questions/concerns or scheduling needs

## 2022-07-19 DIAGNOSIS — N979 Female infertility, unspecified: Secondary | ICD-10-CM | POA: Diagnosis not present

## 2022-07-19 DIAGNOSIS — Z319 Encounter for procreative management, unspecified: Secondary | ICD-10-CM | POA: Diagnosis not present

## 2022-07-19 DIAGNOSIS — Z3183 Encounter for assisted reproductive fertility procedure cycle: Secondary | ICD-10-CM | POA: Diagnosis not present

## 2022-07-21 ENCOUNTER — Other Ambulatory Visit (HOSPITAL_COMMUNITY): Payer: Self-pay

## 2022-07-21 DIAGNOSIS — N96 Recurrent pregnancy loss: Secondary | ICD-10-CM | POA: Diagnosis not present

## 2022-07-21 MED ORDER — METFORMIN HCL 1000 MG PO TABS
ORAL_TABLET | ORAL | 6 refills | Status: DC
  Filled 2022-07-21: qty 60, 30d supply, fill #0

## 2022-07-25 ENCOUNTER — Ambulatory Visit: Payer: PRIVATE HEALTH INSURANCE | Admitting: Physical Therapy

## 2022-07-25 ENCOUNTER — Telehealth: Payer: Self-pay | Admitting: Physical Therapy

## 2022-07-25 NOTE — Telephone Encounter (Signed)
Called pt re: this morning's missed appt - left voicemail, advised on attendance policy, left office callback number. No further appointments scheduled at this time, encouraged patient to reach out for scheduling needs or with any questions/concerns

## 2022-07-26 ENCOUNTER — Other Ambulatory Visit (HOSPITAL_COMMUNITY): Payer: Self-pay

## 2022-08-03 ENCOUNTER — Other Ambulatory Visit (HOSPITAL_COMMUNITY): Payer: Self-pay

## 2022-08-11 DIAGNOSIS — N96 Recurrent pregnancy loss: Secondary | ICD-10-CM | POA: Diagnosis not present

## 2022-08-18 DIAGNOSIS — Z3183 Encounter for assisted reproductive fertility procedure cycle: Secondary | ICD-10-CM | POA: Diagnosis not present

## 2022-08-28 DIAGNOSIS — Z3200 Encounter for pregnancy test, result unknown: Secondary | ICD-10-CM | POA: Diagnosis not present

## 2022-09-15 DIAGNOSIS — N96 Recurrent pregnancy loss: Secondary | ICD-10-CM | POA: Diagnosis not present

## 2022-09-15 DIAGNOSIS — Z3141 Encounter for fertility testing: Secondary | ICD-10-CM | POA: Diagnosis not present

## 2022-09-21 DIAGNOSIS — Z3141 Encounter for fertility testing: Secondary | ICD-10-CM | POA: Diagnosis not present

## 2022-09-21 DIAGNOSIS — N96 Recurrent pregnancy loss: Secondary | ICD-10-CM | POA: Diagnosis not present

## 2022-10-14 ENCOUNTER — Other Ambulatory Visit (HOSPITAL_COMMUNITY): Payer: Self-pay

## 2022-10-23 DIAGNOSIS — Z3183 Encounter for assisted reproductive fertility procedure cycle: Secondary | ICD-10-CM | POA: Diagnosis not present

## 2022-10-31 ENCOUNTER — Other Ambulatory Visit: Payer: Self-pay

## 2022-10-31 ENCOUNTER — Other Ambulatory Visit (HOSPITAL_COMMUNITY): Payer: Self-pay

## 2022-11-01 ENCOUNTER — Other Ambulatory Visit (HOSPITAL_COMMUNITY): Payer: Self-pay

## 2022-11-09 DIAGNOSIS — Z3183 Encounter for assisted reproductive fertility procedure cycle: Secondary | ICD-10-CM | POA: Diagnosis not present

## 2022-11-09 DIAGNOSIS — Z3201 Encounter for pregnancy test, result positive: Secondary | ICD-10-CM | POA: Diagnosis not present

## 2022-11-09 DIAGNOSIS — Z3200 Encounter for pregnancy test, result unknown: Secondary | ICD-10-CM | POA: Diagnosis not present

## 2022-11-13 ENCOUNTER — Other Ambulatory Visit (HOSPITAL_COMMUNITY): Payer: Self-pay

## 2022-11-16 ENCOUNTER — Other Ambulatory Visit (HOSPITAL_COMMUNITY): Payer: Self-pay

## 2022-11-17 ENCOUNTER — Other Ambulatory Visit (HOSPITAL_COMMUNITY): Payer: Self-pay

## 2022-11-23 ENCOUNTER — Other Ambulatory Visit: Payer: Self-pay

## 2022-11-23 ENCOUNTER — Other Ambulatory Visit (HOSPITAL_COMMUNITY): Payer: Self-pay

## 2022-11-23 MED ORDER — ENOXAPARIN SODIUM 40 MG/0.4ML IJ SOSY
40.0000 mg | PREFILLED_SYRINGE | Freq: Every day | INTRAMUSCULAR | 2 refills | Status: DC
  Filled 2022-11-23: qty 12, 30d supply, fill #0
  Filled 2022-12-21: qty 12, 30d supply, fill #1

## 2022-11-23 MED ORDER — HEPARIN SODIUM (PORCINE) 5000 UNIT/ML IJ SOLN
INTRAMUSCULAR | 4 refills | Status: DC
  Filled 2022-11-23: qty 60, 30d supply, fill #0

## 2022-11-23 MED ORDER — "TUBERCULIN SYRINGE 27G X 1/2"" 1 ML MISC"
4 refills | Status: DC
  Filled 2022-11-23 (×2): qty 50, 25d supply, fill #0
  Filled 2022-11-23: qty 60, 30d supply, fill #0
  Filled 2022-11-24: qty 10, 5d supply, fill #0

## 2022-11-23 MED ORDER — "INSULIN SYRINGE-NEEDLE U-100 30G X 1/2"" 1 ML MISC"
4 refills | Status: DC
  Filled 2022-11-23: qty 60, 30d supply, fill #0

## 2022-11-24 ENCOUNTER — Other Ambulatory Visit: Payer: Self-pay

## 2022-11-24 ENCOUNTER — Other Ambulatory Visit (HOSPITAL_COMMUNITY): Payer: Self-pay

## 2022-11-24 ENCOUNTER — Encounter (HOSPITAL_COMMUNITY): Payer: Self-pay

## 2022-11-24 ENCOUNTER — Other Ambulatory Visit (HOSPITAL_BASED_OUTPATIENT_CLINIC_OR_DEPARTMENT_OTHER): Payer: Self-pay

## 2022-11-25 ENCOUNTER — Other Ambulatory Visit (HOSPITAL_BASED_OUTPATIENT_CLINIC_OR_DEPARTMENT_OTHER): Payer: Self-pay

## 2022-11-27 DIAGNOSIS — Z3200 Encounter for pregnancy test, result unknown: Secondary | ICD-10-CM | POA: Diagnosis not present

## 2022-12-01 ENCOUNTER — Other Ambulatory Visit: Payer: Self-pay

## 2022-12-01 ENCOUNTER — Other Ambulatory Visit (HOSPITAL_COMMUNITY): Payer: Self-pay

## 2022-12-04 ENCOUNTER — Other Ambulatory Visit (HOSPITAL_COMMUNITY): Payer: Self-pay

## 2022-12-05 ENCOUNTER — Other Ambulatory Visit: Payer: Self-pay

## 2022-12-11 ENCOUNTER — Other Ambulatory Visit (HOSPITAL_COMMUNITY): Payer: Self-pay

## 2022-12-11 DIAGNOSIS — O0900 Supervision of pregnancy with history of infertility, unspecified trimester: Secondary | ICD-10-CM | POA: Diagnosis not present

## 2022-12-13 ENCOUNTER — Other Ambulatory Visit (HOSPITAL_COMMUNITY): Payer: Self-pay

## 2022-12-14 ENCOUNTER — Other Ambulatory Visit (HOSPITAL_COMMUNITY): Payer: Self-pay

## 2022-12-18 ENCOUNTER — Other Ambulatory Visit (HOSPITAL_COMMUNITY): Payer: Self-pay

## 2022-12-21 ENCOUNTER — Other Ambulatory Visit: Payer: Self-pay

## 2022-12-29 DIAGNOSIS — Z1331 Encounter for screening for depression: Secondary | ICD-10-CM | POA: Diagnosis not present

## 2022-12-29 DIAGNOSIS — Z3689 Encounter for other specified antenatal screening: Secondary | ICD-10-CM | POA: Diagnosis not present

## 2022-12-29 DIAGNOSIS — O09811 Supervision of pregnancy resulting from assisted reproductive technology, first trimester: Secondary | ICD-10-CM | POA: Diagnosis not present

## 2022-12-29 DIAGNOSIS — Z124 Encounter for screening for malignant neoplasm of cervix: Secondary | ICD-10-CM | POA: Diagnosis not present

## 2022-12-29 DIAGNOSIS — Z118 Encounter for screening for other infectious and parasitic diseases: Secondary | ICD-10-CM | POA: Diagnosis not present

## 2022-12-29 DIAGNOSIS — Z3A11 11 weeks gestation of pregnancy: Secondary | ICD-10-CM | POA: Diagnosis not present

## 2022-12-29 DIAGNOSIS — Z3481 Encounter for supervision of other normal pregnancy, first trimester: Secondary | ICD-10-CM | POA: Diagnosis not present

## 2023-01-10 NOTE — L&D Delivery Note (Signed)
   Delivery Note:   [id number] at [redacted]w[redacted]d  Admitting diagnosis: ROM (rupture of membranes), premature [O42.90] Risks: IVF pregnancy; PAI-1 on Lovenox  until last week Onset of labor: 07/02/2023 at 0600 IOL/Augmentation: Pitocin and Cytotec  ROM: 07/02/2023 at 0600, clear fluid  Complete dilation at 07/03/2023 0053 Onset of pushing at 0315 FHR second stage Cat II, variables with pushing  Analgesia/Anesthesia intrapartum:Epidural Pushing in lithotomy position with CNM and L&D staff support. Husband, Eva, and mother, Christobal, present for birth and supportive.  Delivery of a Live born female  Birth Weight:  pending APGAR: 9, 9  Newborn Delivery   Birth date/time: 07/03/2023 03:41:00 Delivery type: Vaginal, Spontaneous     in cephalic presentation, position OA to ROA.  APGAR:1 min-9 , 5 min-9   Nuchal Cord: Yes  x 1 Cord double clamped after cessation of pulsation, cut by Eva.  Collection of cord blood for typing completed. Arterial cord blood sample-No   Placenta delivered-Spontaneous with 3 vessels. Uterotonics: Pitocin Placenta to L&D Uterine tone firm  Bleeding scant  Vaginal laceration identified.  Episiotomy:None Local analgesia: N/A  Repair: 3-0 in usual fashion Est. Blood Loss (mL):211.00  Complications: None  Mom to postpartum. Baby Elijah to Couplet care / Skin to Skin.  Delivery Report:   Review the Delivery Report for details.    Alan MARLA Molt, CNM, MSN 07/03/2023, 4:10 AM

## 2023-01-16 DIAGNOSIS — Z3689 Encounter for other specified antenatal screening: Secondary | ICD-10-CM | POA: Diagnosis not present

## 2023-01-19 IMAGING — US US OB TRANSVAGINAL
1 series · 15 of 28 positions shown · non-contrast
Comparison: 07/24/2019

CLINICAL DATA: Evaluate for retained products of conception prior
to D and C this morning. Missed abortion

EXAM:
OBSTETRIC <14 WK ULTRASOUND
TECHNIQUE: Transabdominal ultrasound was performed for evaluation of the
gestation as well as the maternal uterus and adnexal regions.

[Series 1: us transvaginal non-ob mc & wl · 15 of 73 slices shown]
[im 1/73]
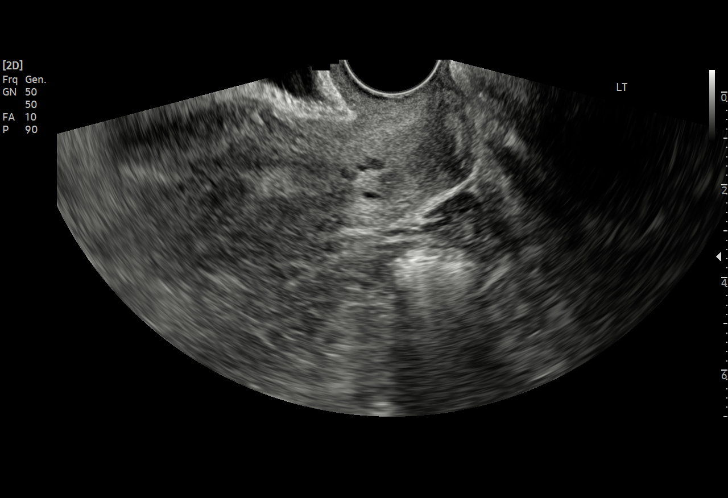
[im 6/73]
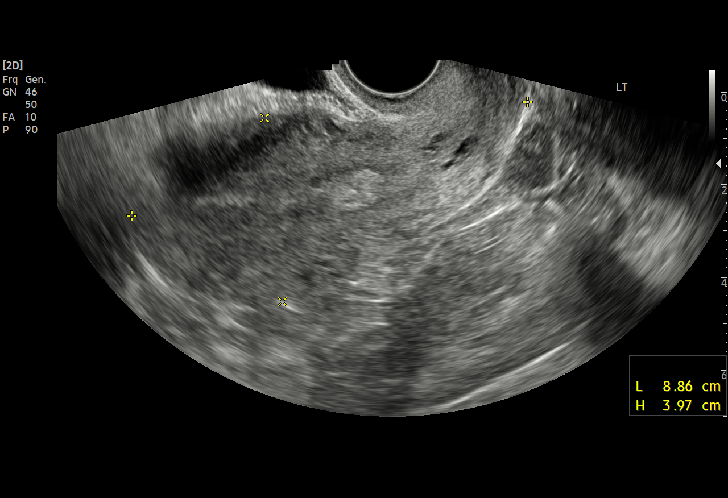
[im 11/73]
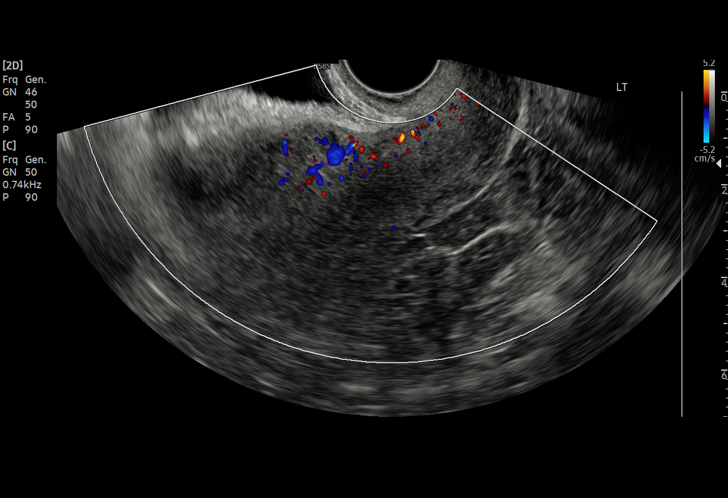
[im 17/73]
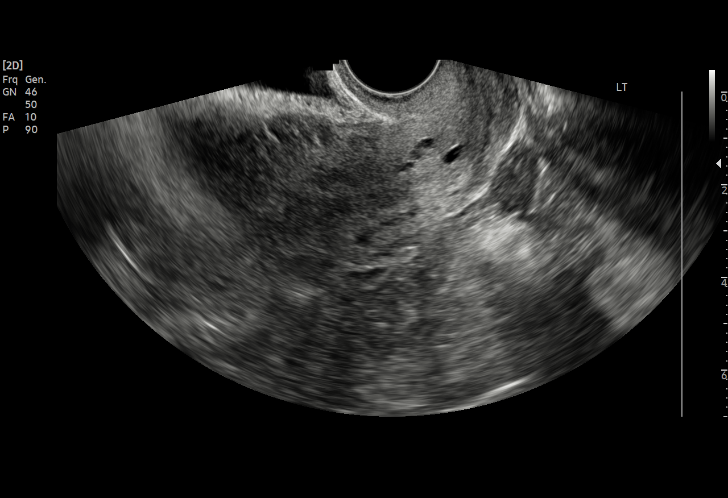
[im 22/73]
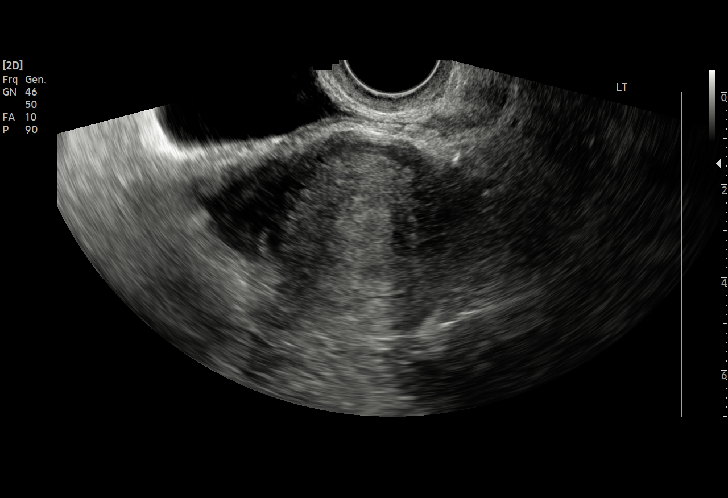
[im 27/73]
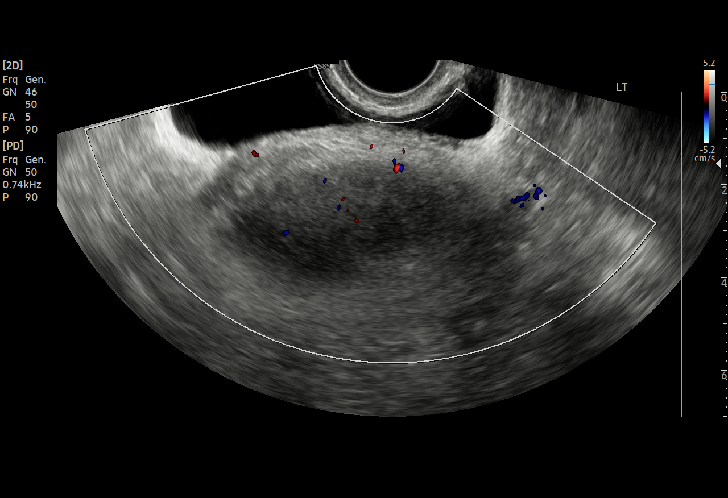
[im 33/73]
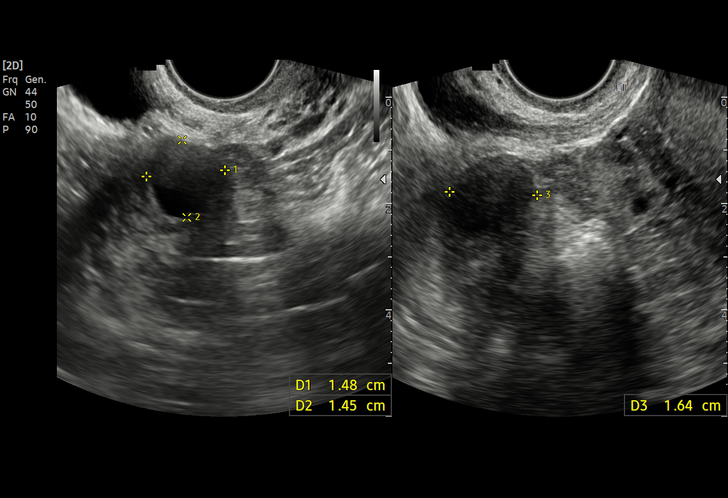
[im 38/73]
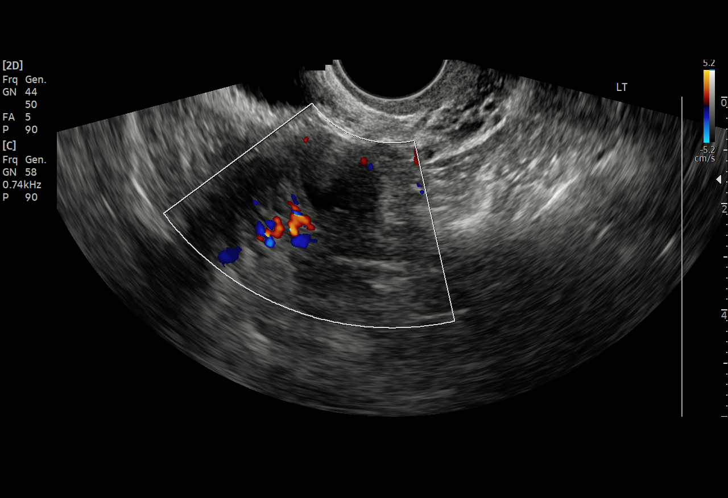
[im 41/73]
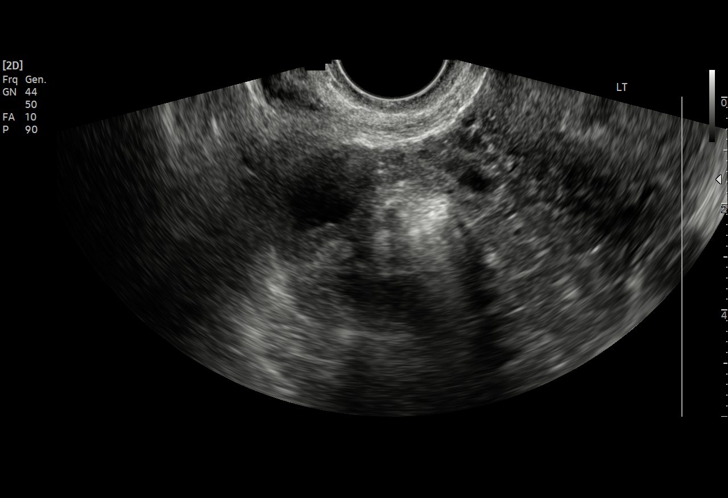
[im 46/73]
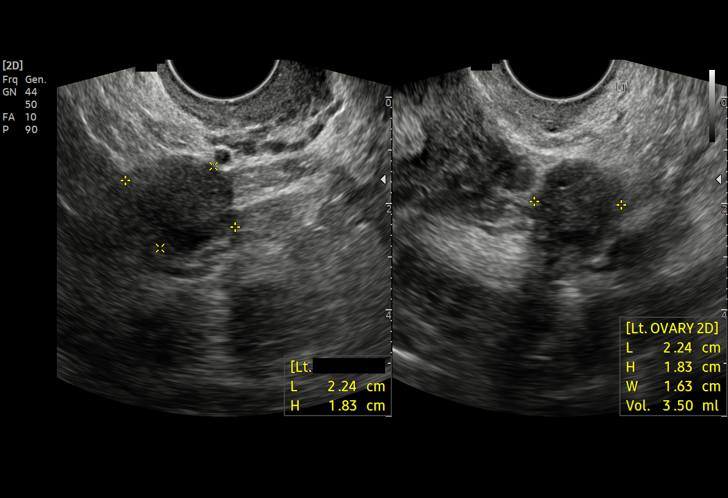
[im 51/73]
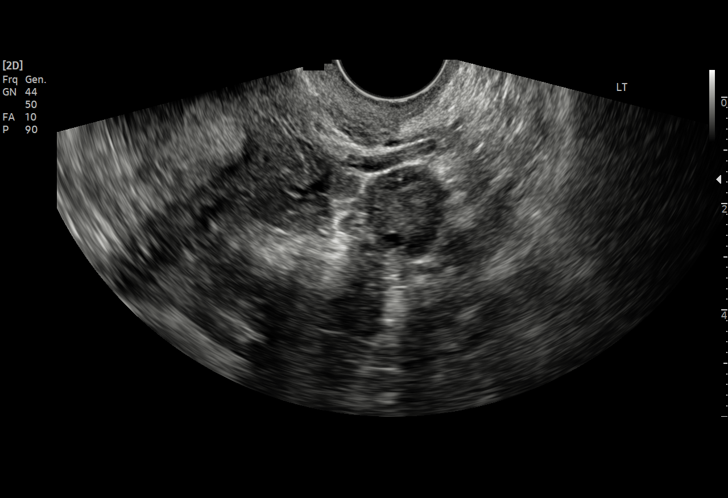
[im 57/73]
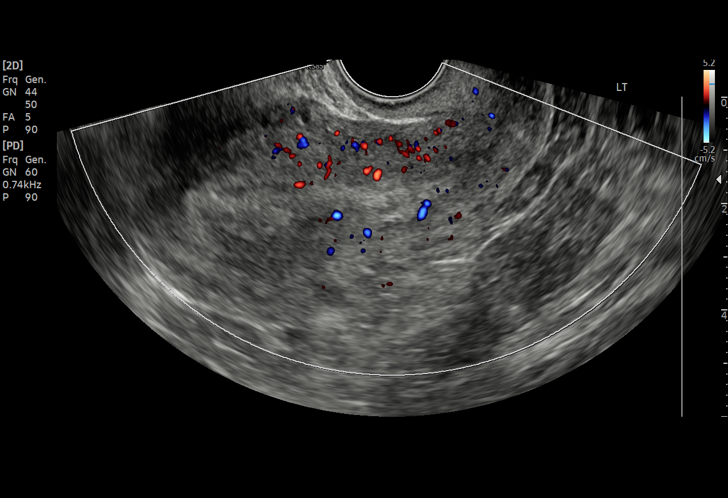
[im 62/73]
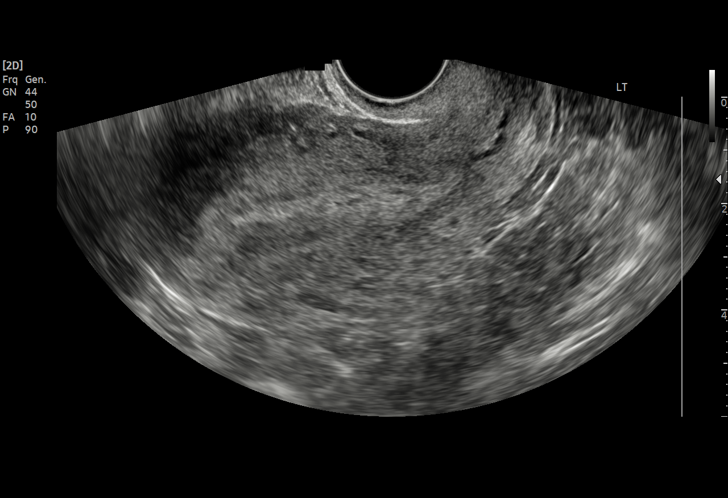
[im 67/73]
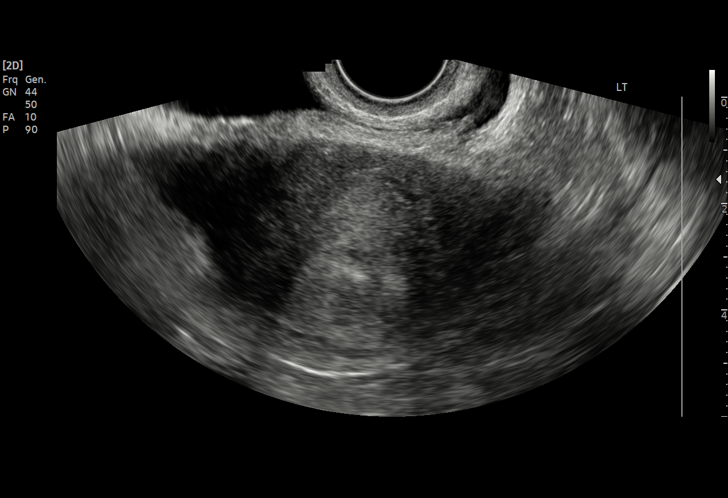
[im 73/73]
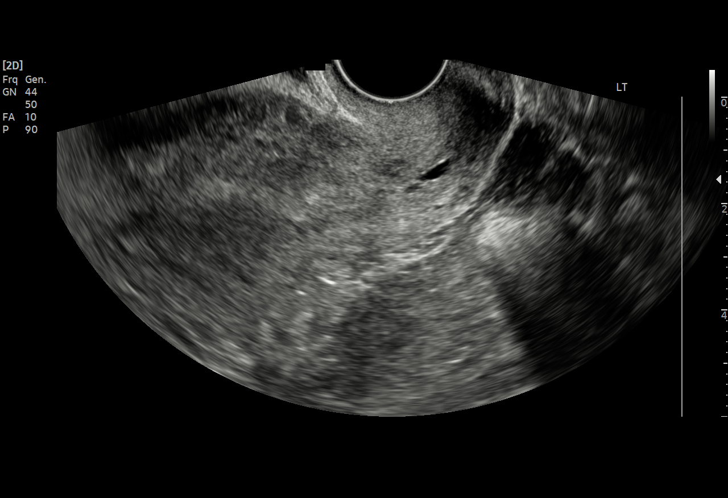

[15 of 28 positions shown; findings below may reference images not displayed]

FINDINGS: No intra or extra uterine gestational sac is seen. The endometrium
measures up to 8 mm in thickness without hypervascular area or
notable heterogeneity.

Physiologic appearance of the ovaries. No adnexal mass or pelvic
fluid.
IMPRESSION: The endometrial stripe measures up to 8 mm. No hypervascular areas
seen in the endometrial cavity.

## 2023-01-22 ENCOUNTER — Other Ambulatory Visit (HOSPITAL_BASED_OUTPATIENT_CLINIC_OR_DEPARTMENT_OTHER): Payer: Self-pay

## 2023-01-22 MED ORDER — ENOXAPARIN SODIUM 40 MG/0.4ML IJ SOSY
40.0000 mg | PREFILLED_SYRINGE | INTRAMUSCULAR | 4 refills | Status: DC
  Filled 2023-01-22: qty 12, 30d supply, fill #0
  Filled 2023-02-19: qty 12, 30d supply, fill #1
  Filled 2023-03-20: qty 12, 30d supply, fill #2
  Filled 2023-04-18: qty 12, 30d supply, fill #3
  Filled 2023-05-15: qty 12, 30d supply, fill #4

## 2023-01-30 DIAGNOSIS — Z361 Encounter for antenatal screening for raised alphafetoprotein level: Secondary | ICD-10-CM | POA: Diagnosis not present

## 2023-01-30 DIAGNOSIS — O09812 Supervision of pregnancy resulting from assisted reproductive technology, second trimester: Secondary | ICD-10-CM | POA: Diagnosis not present

## 2023-01-30 DIAGNOSIS — Z3A15 15 weeks gestation of pregnancy: Secondary | ICD-10-CM | POA: Diagnosis not present

## 2023-02-18 ENCOUNTER — Inpatient Hospital Stay (HOSPITAL_COMMUNITY)
Admission: AD | Admit: 2023-02-18 | Discharge: 2023-02-19 | Disposition: A | Discharge status: Home / Self Care | Payer: 59 | Attending: Obstetrics and Gynecology | Admitting: Obstetrics and Gynecology

## 2023-02-18 ENCOUNTER — Encounter (HOSPITAL_COMMUNITY): Payer: Self-pay | Admitting: *Deleted

## 2023-02-18 DIAGNOSIS — O26892 Other specified pregnancy related conditions, second trimester: Secondary | ICD-10-CM | POA: Diagnosis present

## 2023-02-18 DIAGNOSIS — O219 Vomiting of pregnancy, unspecified: Secondary | ICD-10-CM | POA: Insufficient documentation

## 2023-02-18 DIAGNOSIS — Z8616 Personal history of COVID-19: Secondary | ICD-10-CM | POA: Diagnosis not present

## 2023-02-18 DIAGNOSIS — O99282 Endocrine, nutritional and metabolic diseases complicating pregnancy, second trimester: Secondary | ICD-10-CM | POA: Insufficient documentation

## 2023-02-18 DIAGNOSIS — R42 Dizziness and giddiness: Secondary | ICD-10-CM

## 2023-02-18 DIAGNOSIS — Z3A18 18 weeks gestation of pregnancy: Secondary | ICD-10-CM | POA: Diagnosis not present

## 2023-02-18 DIAGNOSIS — O99280 Endocrine, nutritional and metabolic diseases complicating pregnancy, unspecified trimester: Secondary | ICD-10-CM

## 2023-02-18 DIAGNOSIS — E860 Dehydration: Secondary | ICD-10-CM | POA: Insufficient documentation

## 2023-02-18 LAB — URINALYSIS, ROUTINE W REFLEX MICROSCOPIC
Bilirubin Urine: NEGATIVE
Glucose, UA: NEGATIVE mg/dL
Hgb urine dipstick: NEGATIVE
Ketones, ur: NEGATIVE mg/dL
Leukocytes,Ua: NEGATIVE
Nitrite: NEGATIVE
Protein, ur: NEGATIVE mg/dL
Specific Gravity, Urine: 1.009 (ref 1.005–1.030)
pH: 6 (ref 5.0–8.0)

## 2023-02-18 MED ORDER — LACTATED RINGERS IV BOLUS
1000.0000 mL | Freq: Once | INTRAVENOUS | Status: AC
  Administered 2023-02-18: 1000 mL via INTRAVENOUS

## 2023-02-18 NOTE — MAU Note (Signed)
.  Brooke Sharp is a 28 y.o. at Unknown here in MAU reporting: has not eaten much today . Vomited x1 this morning . Has not drank much today either. Took a show and felt lightheaded . Laid down and tried to eat some dinner but got nauseted and still feels light headed like she is going to pass out.  LMP:  Onset of complaint: this morning Pain score: 0 Vitals:   02/18/23 2228  BP: 116/73  Pulse: 97  Resp: 18  Temp: 97.9 F (36.6 C)     FHT: 150 Lab orders placed from triage: u/a

## 2023-02-19 DIAGNOSIS — E860 Dehydration: Secondary | ICD-10-CM

## 2023-02-19 DIAGNOSIS — O99282 Endocrine, nutritional and metabolic diseases complicating pregnancy, second trimester: Secondary | ICD-10-CM | POA: Diagnosis not present

## 2023-02-19 DIAGNOSIS — Z3A18 18 weeks gestation of pregnancy: Secondary | ICD-10-CM | POA: Diagnosis not present

## 2023-02-19 LAB — COMPREHENSIVE METABOLIC PANEL
ALT: 17 U/L (ref 0–44)
AST: 24 U/L (ref 15–41)
Albumin: 3.3 g/dL — ABNORMAL LOW (ref 3.5–5.0)
Alkaline Phosphatase: 74 U/L (ref 38–126)
Anion gap: 11 (ref 5–15)
BUN: 7 mg/dL (ref 6–20)
CO2: 22 mmol/L (ref 22–32)
Calcium: 9.6 mg/dL (ref 8.9–10.3)
Chloride: 103 mmol/L (ref 98–111)
Creatinine, Ser: 0.53 mg/dL (ref 0.44–1.00)
GFR, Estimated: >60 mL/min (ref >60)
Glucose, Bld: 102 mg/dL — ABNORMAL HIGH (ref 70–99)
Potassium: 3.2 mmol/L — ABNORMAL LOW (ref 3.5–5.1)
Sodium: 136 mmol/L (ref 135–145)
Total Bilirubin: 0.4 mg/dL (ref 0.0–1.2)
Total Protein: 6.8 g/dL (ref 6.5–8.1)

## 2023-02-19 LAB — RESPIRATORY PANEL BY PCR

## 2023-02-19 LAB — CBC
HCT: 38.3 % (ref 36.0–46.0)
Hemoglobin: 13.1 g/dL (ref 12.0–15.0)
MCH: 29.9 pg (ref 26.0–34.0)
MCHC: 34.2 g/dL (ref 30.0–36.0)
MCV: 87.4 fL (ref 80.0–100.0)
Platelets: 259 10*3/uL (ref 150–400)
RBC: 4.38 MIL/uL (ref 3.87–5.11)
RDW: 12.8 % (ref 11.5–15.5)
WBC: 12.5 10*3/uL — ABNORMAL HIGH (ref 4.0–10.5)
nRBC: 0 % (ref 0.0–0.2)

## 2023-02-19 MED ORDER — POTASSIUM CHLORIDE CRYS ER 20 MEQ PO TBCR
40.0000 meq | EXTENDED_RELEASE_TABLET | Freq: Once | ORAL | Status: AC
  Administered 2023-02-19: 40 meq via ORAL
  Filled 2023-02-19: qty 2

## 2023-02-19 MED ORDER — ONDANSETRON 4 MG PO TBDP: 4.0000 mg | ORAL_TABLET | Freq: Three times a day (TID) | ORAL | 0 refills | Status: DC | PRN

## 2023-02-19 MED ORDER — LACTATED RINGERS IV BOLUS
1000.0000 mL | Freq: Once | INTRAVENOUS | Status: AC
  Administered 2023-02-19: 1000 mL via INTRAVENOUS

## 2023-02-19 NOTE — MAU Provider Note (Addendum)
 History     CSN: [id number]  Arrival date and time: 02/18/23 2207   Event Date/Time   First Provider Initiated Contact with Patient 02/18/2023 10:26 PM   Chief Complaint  Patient presents with   Dizziness    HPI  Brooke Sharp is a 28 y.o. G2P0 at [redacted]w[redacted]d who presents to the MAU for feeling lightheaded and presyncopal. Reports feeling nauseous earlier in the day so had not eaten or had much to drink throughout the day. She states she has had some nausea in pregnancy, no meds at home. Feels she is likely dehydrated. No recent fevers/chills, abd pain, c/d, urinary sxs. Denies VB.   Past Medical History:  Diagnosis Date   Adjustment disorder with mixed anxiety and depressed mood 02/25/2020   COVID 09/2018   weakness, sob muscle cramps x 2 weeks all symptoms resolved   History of palpitations in adulthood    summer 2021, wore heart monitor from pcp dr Asa Lauth [facility], issue resolved   Wears glasses 06/24/2020    Past Surgical History:  Procedure Laterality Date   left shoulder surgery Left 2017   labral tear   WISDOM TOOTH EXTRACTION  2017    Family History  Problem Relation Age of Onset   Depression Mother    Depression Paternal Grandmother    Drug abuse Paternal Grandmother     Social History   Tobacco Use   Smoking status: Never   Smokeless tobacco: Never  Vaping Use   Vaping status: Never Used  Substance Use Topics   Alcohol use: Yes    Comment: occ   Drug use: Not Currently    Comment: experimental use of marijuana 7 years ago    Allergies:  Allergies  Allergen Reactions   Heparin  Hives   Shellfish Allergy  Anaphylaxis   Latex Hives   Meloxicam Swelling    Medications Prior to Admission  Medication Sig Dispense Refill Last Dose/Taking   aspirin EC 81 MG tablet Take 81 mg by mouth daily. Swallow whole.   02/17/2023 Bedtime   enoxaparin  (LOVENOX ) 40 MG/0.4ML injection Inject 0.4 mL every day by subcutaneous route as  directed. 12 mL 4 02/18/2023 at  7:00 PM   Prenatal Vit-Fe Fumarate-FA (MULTIVITAMIN-PRENATAL) 27-0.8 MG TABS tablet Take 1 tablet by mouth daily at 12 noon. With dhea   02/18/2023   doxycycline  (VIBRA -TABS) 100 MG tablet Take 1 tablet (100 mg total) by mouth 2 (two) times daily. 40 tablet 0    enoxaparin  (LOVENOX ) 40 MG/0.4ML injection Inject 0.4 mLs (40 mg total) into the skin daily. 12 mL 2    estradiol  (ESTRACE ) 2 MG tablet Take 1 tablet (2 mg total) by mouth 2 (two) times daily. 60 tablet 3    estradiol  (VIVELLE -DOT) 0.1 MG/24HR patch Place 1 patch (0.1 mg total) onto the skin every 3 (three) days. 8 patch 3    Follitropin  Alfa (GONAL -F RFF REDIJECT) 900 UNIT/1.5ML SOPN Inject 300 Units into the skin daily. 6 mL 1    Cetrorelix  Acetate (CETROTIDE ) 0.25 MG KIT Reconstitute and inject 1 syringe daily 5 kit 1    heparin  5000 UNIT/ML injection Inject 1 vial twice daily 60 mL 4    Chorionic Gonadotropin  (NOVAREL ) 5000 units SOLR Mix with 1 ml diluent  and inject at exact time given 1 each 0    Insulin  Syringe-Needle U-100 30G X 1/2" 1 ML MISC Use 1 syringe per vial of heparin  60 each 4    leuprolide  (LUPRON  DEPOT, 12-MONTH,) 3.75 MG  injection Inject at exact time given as directed 1 each 0    Menotropins  (MENOPUR ) 75 units SOLR Inject 75 Units into the skin daily or as directed 10 each 1    metFORMIN  (GLUCOPHAGE ) 1000 MG tablet Take 1/2 tablet by mouth every day for a week then 1/2 tab twice daily for week 2, then take 1 tab every morning and 1/2 tab in the evening for week 3 then take 1 tab twice daily for week 4. 60 tablet 6    methylPREDNISolone  (MEDROL ) 4 MG tablet Take 2 tablets (8 mg total) by mouth 2 (two) times daily. 16 tablet 1    metoCLOPramide  (REGLAN ) 10 MG tablet Take 1 tablet (10 mg total) by mouth every 6 (six) hours. 12 tablet 0    NEEDLE, DISP, 22 G (EASY TOUCH HYPODERMIC NEEDLE) 22G X 1-1/2" MISC Use as directed 30 each 3    NEEDLE, DISP, 27 G (BD DISP NEEDLES) 27G X 1/2" MISC Use as  directed 30 each 3    progesterone  50 MG/ML injection Inject 1 mL (50 mg total) into the muscle daily. 30 mL 3    SYRINGE-NEEDLE, DISP, 3 ML (B-D 3CC LUER-LOK SYR 18GX1-1/2) 18G X 1-1/2" 3 ML MISC Use as directed 60 each 3    TUBERCULIN SYR 1CC/27GX1/2" 27G X 1/2" 1 ML MISC Use 1 syringe per vial of Heparin  60 each 4    VITAMIN D PO Take by mouth daily.       ROS reviewed and pertinent positives and negatives as documented in HPI.  Physical Exam   Blood pressure 118/69, pulse 86, temperature 97.9 F (36.6 C), resp. rate 18, height 5\' 4"  (1.626 m), weight 93.9 kg, last menstrual period 04/20/2022, SpO2 98%.  Physical Exam Constitutional:      General: She is not in acute distress.    Appearance: Normal appearance. She is not ill-appearing.  HENT:     Head: Normocephalic and atraumatic.  Cardiovascular:     Rate and Rhythm: Normal rate.  Pulmonary:     Effort: Pulmonary effort is normal.     Breath sounds: Normal breath sounds.  Abdominal:     Palpations: Abdomen is soft.     Tenderness: There is no abdominal tenderness. There is no guarding.  Musculoskeletal:        General: Normal range of motion.  Skin:    General: Skin is warm and dry.     Findings: No rash.  Neurological:     General: No focal deficit present.     Mental Status: She is alert and oriented to person, place, and time.     MAU Course  Procedures  MDM 28 y.o. G2P0 at [redacted]w[redacted]d presenting for lightheadedness and nausea. She is hemodynamically stable, exam reassuring. Suspect nausea/vomiting of pregnancy vs anemia vs viral gastroenteritis causing symptoms. Pt unable to provide urine sample. Suspect significant dehydration based on hx and decreased UOP throughout the day. Will get labs and provide 1L bolus and reassess. Will get viral panel.    1610 Orthostatics done, borderline orthostatic SBP. Still lightheaded/dizzy. Will give second bolus.  0153 Pt re-assessed after second bag of fluids, feeling much  better, able to get up and go to bathroom without feeling lightheaded. On labs, K low, so will replete. CBC, CMP otherwise unremarkable. RVP pending, will send MyChart message w results. Stable for d/c.  Assessment and Plan  Dehydration during pregnancy  Nausea/vomiting in pregnancy  Lightheadedness Suspect presenting sx of presyncope/nausea/lightheadedness 2/2 dehydration RVP collected  to r/o underlying viral etiology of sxs -- will send MyChart message w results Labs ok -- K repleted Borderline orthostatic VS Sxs resolved after 2L IV fluids Rec increasing hydration Stable for d/c    Melanie Spires, MD OB Fellow, [facility], Center for [facility]  02/19/2023, 1:34 AM

## 2023-02-21 ENCOUNTER — Other Ambulatory Visit (HOSPITAL_BASED_OUTPATIENT_CLINIC_OR_DEPARTMENT_OTHER): Payer: Self-pay

## 2023-02-23 DIAGNOSIS — E876 Hypokalemia: Secondary | ICD-10-CM | POA: Diagnosis not present

## 2023-02-23 DIAGNOSIS — Z7901 Long term (current) use of anticoagulants: Secondary | ICD-10-CM | POA: Diagnosis not present

## 2023-02-23 DIAGNOSIS — O09812 Supervision of pregnancy resulting from assisted reproductive technology, second trimester: Secondary | ICD-10-CM | POA: Diagnosis not present

## 2023-02-23 DIAGNOSIS — Z3A19 19 weeks gestation of pregnancy: Secondary | ICD-10-CM | POA: Diagnosis not present

## 2023-02-23 DIAGNOSIS — Z363 Encounter for antenatal screening for malformations: Secondary | ICD-10-CM | POA: Diagnosis not present

## 2023-03-22 DIAGNOSIS — M5441 Lumbago with sciatica, right side: Secondary | ICD-10-CM | POA: Diagnosis not present

## 2023-03-22 DIAGNOSIS — M25551 Pain in right hip: Secondary | ICD-10-CM | POA: Diagnosis not present

## 2023-03-22 DIAGNOSIS — M9903 Segmental and somatic dysfunction of lumbar region: Secondary | ICD-10-CM | POA: Diagnosis not present

## 2023-03-22 DIAGNOSIS — M9905 Segmental and somatic dysfunction of pelvic region: Secondary | ICD-10-CM | POA: Diagnosis not present

## 2023-03-28 DIAGNOSIS — M9903 Segmental and somatic dysfunction of lumbar region: Secondary | ICD-10-CM | POA: Diagnosis not present

## 2023-03-28 DIAGNOSIS — M5441 Lumbago with sciatica, right side: Secondary | ICD-10-CM | POA: Diagnosis not present

## 2023-03-28 DIAGNOSIS — M25551 Pain in right hip: Secondary | ICD-10-CM | POA: Diagnosis not present

## 2023-03-28 DIAGNOSIS — M9905 Segmental and somatic dysfunction of pelvic region: Secondary | ICD-10-CM | POA: Diagnosis not present

## 2023-03-29 DIAGNOSIS — M9903 Segmental and somatic dysfunction of lumbar region: Secondary | ICD-10-CM | POA: Diagnosis not present

## 2023-03-29 DIAGNOSIS — M9905 Segmental and somatic dysfunction of pelvic region: Secondary | ICD-10-CM | POA: Diagnosis not present

## 2023-03-29 DIAGNOSIS — M25551 Pain in right hip: Secondary | ICD-10-CM | POA: Diagnosis not present

## 2023-03-29 DIAGNOSIS — M5441 Lumbago with sciatica, right side: Secondary | ICD-10-CM | POA: Diagnosis not present

## 2023-04-03 DIAGNOSIS — M25551 Pain in right hip: Secondary | ICD-10-CM | POA: Diagnosis not present

## 2023-04-03 DIAGNOSIS — M5441 Lumbago with sciatica, right side: Secondary | ICD-10-CM | POA: Diagnosis not present

## 2023-04-03 DIAGNOSIS — M9903 Segmental and somatic dysfunction of lumbar region: Secondary | ICD-10-CM | POA: Diagnosis not present

## 2023-04-03 DIAGNOSIS — M9905 Segmental and somatic dysfunction of pelvic region: Secondary | ICD-10-CM | POA: Diagnosis not present

## 2023-04-05 DIAGNOSIS — M9905 Segmental and somatic dysfunction of pelvic region: Secondary | ICD-10-CM | POA: Diagnosis not present

## 2023-04-05 DIAGNOSIS — M9903 Segmental and somatic dysfunction of lumbar region: Secondary | ICD-10-CM | POA: Diagnosis not present

## 2023-04-05 DIAGNOSIS — M25551 Pain in right hip: Secondary | ICD-10-CM | POA: Diagnosis not present

## 2023-04-05 DIAGNOSIS — M5441 Lumbago with sciatica, right side: Secondary | ICD-10-CM | POA: Diagnosis not present

## 2023-04-09 DIAGNOSIS — M9905 Segmental and somatic dysfunction of pelvic region: Secondary | ICD-10-CM | POA: Diagnosis not present

## 2023-04-09 DIAGNOSIS — M9903 Segmental and somatic dysfunction of lumbar region: Secondary | ICD-10-CM | POA: Diagnosis not present

## 2023-04-09 DIAGNOSIS — M5441 Lumbago with sciatica, right side: Secondary | ICD-10-CM | POA: Diagnosis not present

## 2023-04-09 DIAGNOSIS — M25551 Pain in right hip: Secondary | ICD-10-CM | POA: Diagnosis not present

## 2023-04-12 ENCOUNTER — Other Ambulatory Visit (HOSPITAL_BASED_OUTPATIENT_CLINIC_OR_DEPARTMENT_OTHER): Payer: Self-pay

## 2023-04-12 DIAGNOSIS — M25551 Pain in right hip: Secondary | ICD-10-CM | POA: Diagnosis not present

## 2023-04-12 DIAGNOSIS — M9905 Segmental and somatic dysfunction of pelvic region: Secondary | ICD-10-CM | POA: Diagnosis not present

## 2023-04-12 DIAGNOSIS — M5441 Lumbago with sciatica, right side: Secondary | ICD-10-CM | POA: Diagnosis not present

## 2023-04-12 DIAGNOSIS — M9903 Segmental and somatic dysfunction of lumbar region: Secondary | ICD-10-CM | POA: Diagnosis not present

## 2023-04-12 MED ORDER — ONDANSETRON 4 MG PO TBDP
4.0000 mg | ORAL_TABLET | Freq: Two times a day (BID) | ORAL | 0 refills | Status: DC
  Filled 2023-04-12: qty 30, 15d supply, fill #0

## 2023-04-18 DIAGNOSIS — M5441 Lumbago with sciatica, right side: Secondary | ICD-10-CM | POA: Diagnosis not present

## 2023-04-18 DIAGNOSIS — M9905 Segmental and somatic dysfunction of pelvic region: Secondary | ICD-10-CM | POA: Diagnosis not present

## 2023-04-18 DIAGNOSIS — M9903 Segmental and somatic dysfunction of lumbar region: Secondary | ICD-10-CM | POA: Diagnosis not present

## 2023-04-18 DIAGNOSIS — M25551 Pain in right hip: Secondary | ICD-10-CM | POA: Diagnosis not present

## 2023-04-20 ENCOUNTER — Other Ambulatory Visit (HOSPITAL_BASED_OUTPATIENT_CLINIC_OR_DEPARTMENT_OTHER): Payer: Self-pay

## 2023-04-24 ENCOUNTER — Other Ambulatory Visit: Payer: Self-pay

## 2023-04-24 ENCOUNTER — Ambulatory Visit: Attending: Cardiology

## 2023-04-24 DIAGNOSIS — R55 Syncope and collapse: Secondary | ICD-10-CM

## 2023-04-24 NOTE — Addendum Note (Signed)
 Addended by: Jeneen Mire on: 04/24/2023 01:57 PM   Modules accepted: Orders

## 2023-04-24 NOTE — Progress Notes (Unsigned)
 Enrolled for [employer] to mail a ZIO AT Live Telemetry monitor to patients address on file.

## 2023-04-24 NOTE — Progress Notes (Addendum)
 12 day Zio live ordered.  Called pt, went over Zio instructions. Dr. Emmette Harms wants pt to have a Echo prior to appt with her. Pt verbalized understanding, no questions at this time.

## 2023-04-25 DIAGNOSIS — M9905 Segmental and somatic dysfunction of pelvic region: Secondary | ICD-10-CM | POA: Diagnosis not present

## 2023-04-25 DIAGNOSIS — M25551 Pain in right hip: Secondary | ICD-10-CM | POA: Diagnosis not present

## 2023-04-25 DIAGNOSIS — M9903 Segmental and somatic dysfunction of lumbar region: Secondary | ICD-10-CM | POA: Diagnosis not present

## 2023-04-25 DIAGNOSIS — M5441 Lumbago with sciatica, right side: Secondary | ICD-10-CM | POA: Diagnosis not present

## 2023-04-27 DIAGNOSIS — R55 Syncope and collapse: Secondary | ICD-10-CM | POA: Diagnosis not present

## 2023-05-02 DIAGNOSIS — M9905 Segmental and somatic dysfunction of pelvic region: Secondary | ICD-10-CM | POA: Diagnosis not present

## 2023-05-02 DIAGNOSIS — Z3A28 28 weeks gestation of pregnancy: Secondary | ICD-10-CM | POA: Diagnosis not present

## 2023-05-02 DIAGNOSIS — O99113 Other diseases of the blood and blood-forming organs and certain disorders involving the immune mechanism complicating pregnancy, third trimester: Secondary | ICD-10-CM | POA: Diagnosis not present

## 2023-05-02 DIAGNOSIS — M25551 Pain in right hip: Secondary | ICD-10-CM | POA: Diagnosis not present

## 2023-05-02 DIAGNOSIS — O09813 Supervision of pregnancy resulting from assisted reproductive technology, third trimester: Secondary | ICD-10-CM | POA: Diagnosis not present

## 2023-05-02 DIAGNOSIS — Z1331 Encounter for screening for depression: Secondary | ICD-10-CM | POA: Diagnosis not present

## 2023-05-02 DIAGNOSIS — M5441 Lumbago with sciatica, right side: Secondary | ICD-10-CM | POA: Diagnosis not present

## 2023-05-02 DIAGNOSIS — M9903 Segmental and somatic dysfunction of lumbar region: Secondary | ICD-10-CM | POA: Diagnosis not present

## 2023-05-02 DIAGNOSIS — Z3689 Encounter for other specified antenatal screening: Secondary | ICD-10-CM | POA: Diagnosis not present

## 2023-05-05 ENCOUNTER — Encounter (HOSPITAL_COMMUNITY): Payer: Self-pay | Admitting: Obstetrics

## 2023-05-05 ENCOUNTER — Inpatient Hospital Stay (HOSPITAL_COMMUNITY)
Admission: AD | Admit: 2023-05-05 | Discharge: 2023-05-06 | Disposition: A | Discharge status: Home / Self Care | Attending: Obstetrics | Admitting: Obstetrics

## 2023-05-05 DIAGNOSIS — W010XXA Fall on same level from slipping, tripping and stumbling without subsequent striking against object, initial encounter: Secondary | ICD-10-CM | POA: Insufficient documentation

## 2023-05-05 DIAGNOSIS — Z7901 Long term (current) use of anticoagulants: Secondary | ICD-10-CM | POA: Diagnosis not present

## 2023-05-05 DIAGNOSIS — Z3689 Encounter for other specified antenatal screening: Secondary | ICD-10-CM | POA: Diagnosis not present

## 2023-05-05 DIAGNOSIS — O26893 Other specified pregnancy related conditions, third trimester: Secondary | ICD-10-CM | POA: Diagnosis not present

## 2023-05-05 DIAGNOSIS — Z3A29 29 weeks gestation of pregnancy: Secondary | ICD-10-CM | POA: Diagnosis not present

## 2023-05-05 DIAGNOSIS — R109 Unspecified abdominal pain: Secondary | ICD-10-CM | POA: Diagnosis not present

## 2023-05-05 DIAGNOSIS — O9A213 Injury, poisoning and certain other consequences of external causes complicating pregnancy, third trimester: Secondary | ICD-10-CM | POA: Diagnosis not present

## 2023-05-05 LAB — COMPREHENSIVE METABOLIC PANEL WITH GFR
ALT: 18 U/L (ref 0–44)
AST: 24 U/L (ref 15–41)
Albumin: 3 g/dL — ABNORMAL LOW (ref 3.5–5.0)
Alkaline Phosphatase: 88 U/L (ref 38–126)
Anion gap: 8 (ref 5–15)
BUN: 7 mg/dL (ref 6–20)
CO2: 23 mmol/L (ref 22–32)
Calcium: 8.9 mg/dL (ref 8.9–10.3)
Chloride: 104 mmol/L (ref 98–111)
Creatinine, Ser: 0.5 mg/dL (ref 0.44–1.00)
GFR, Estimated: >60 mL/min (ref >60)
Glucose, Bld: 96 mg/dL (ref 70–99)
Potassium: 3.7 mmol/L (ref 3.5–5.1)
Sodium: 135 mmol/L (ref 135–145)
Total Bilirubin: 0.4 mg/dL (ref 0.0–1.2)
Total Protein: 6.5 g/dL (ref 6.5–8.1)

## 2023-05-05 LAB — CBC
HCT: 34 % — ABNORMAL LOW (ref 36.0–46.0)
Hemoglobin: 11.6 g/dL — ABNORMAL LOW (ref 12.0–15.0)
MCH: 29.7 pg (ref 26.0–34.0)
MCHC: 34.1 g/dL (ref 30.0–36.0)
MCV: 87 fL (ref 80.0–100.0)
Platelets: 179 10*3/uL (ref 150–400)
RBC: 3.91 MIL/uL (ref 3.87–5.11)
RDW: 13.2 % (ref 11.5–15.5)
WBC: 12.4 10*3/uL — ABNORMAL HIGH (ref 4.0–10.5)
nRBC: 0 % (ref 0.0–0.2)

## 2023-05-05 NOTE — MAU Note (Signed)
..  Brooke Sharp is a 28 y.o. at [redacted]w[redacted]d here in MAU reporting: a fall that occurred around 2000. Pt fell onto her knee and immediately started cramping. Unsure if her abdomen met the floor or not. Was told by Dr. Belle Box to come be assessed in MAU.  Denies bleeding, leaking fluid. Has felt baby a couple of times since the fall.   Pain score: 4/10 lower abdominal cramping  Vitals:   05/05/23 2150  BP: 118/61  Pulse: 98  Resp: 16  Temp: 98.2 F (36.8 C)     FHT:145 Lab orders placed from triage:  UA

## 2023-05-05 NOTE — MAU Provider Note (Signed)
 Chief Complaint:  Fall   HPI    Brooke Sharp is a 28 y.o. G4P0030 at [redacted]w[redacted]d who presents to maternity admissions reporting  a slip and fall onto her knees around 2000. She id unaware if she hit her abdomen but reports mild cramping after occurrence now resolved. Denies any VB, LOF, and reports fetal movements are present. She is currently on a heart monitor for syncopal episodes and reports this is an IVF pregnancy and she is on Lovenox  for PAI 1 mutation otherwise uncomplicated thus far.   Pregnancy Course: [place]  Past Medical History:  Diagnosis Date   Adjustment disorder with mixed anxiety and depressed mood 02/25/2020   COVID 09/2018   weakness, sob muscle cramps x 2 weeks all symptoms resolved   History of palpitations in adulthood    summer 2021, wore heart monitor from pcp dr Asa Lauth [facility], issue resolved   Wears glasses 06/24/2020   OB History  Gravida Para Term Preterm AB Living  4    3   SAB IAB Ectopic Multiple Live Births  3        # Outcome Date GA Lbr Len/2nd Weight Sex Type Anes PTL Lv  4 Current           3 SAB 06/2020 [redacted]w[redacted]d         2 SAB 01/2020 [redacted]w[redacted]d         1 SAB 07/2019 [redacted]w[redacted]d          Past Surgical History:  Procedure Laterality Date   left shoulder surgery Left 2017   labral tear   WISDOM TOOTH EXTRACTION  2017   Family History  Problem Relation Age of Onset   Depression Mother    Depression Paternal Grandmother    Drug abuse Paternal Grandmother    Social History   Tobacco Use   Smoking status: Never   Smokeless tobacco: Never  Vaping Use   Vaping status: Never Used  Substance Use Topics   Alcohol use: Not Currently    Comment: occ   Drug use: Not Currently    Comment: experimental use of marijuana 7 years ago   Allergies  Allergen Reactions   Heparin  Hives   Shellfish Allergy  Anaphylaxis   Latex Hives   Meloxicam Swelling   No medications prior to admission.    I have reviewed patient's  Past Medical Hx, Surgical Hx, Family Hx, Social Hx, medications and allergies.   ROS  Pertinent items noted in HPI and remainder of comprehensive ROS otherwise negative.   PHYSICAL EXAM  Patient Vitals for the past 24 hrs:  BP Temp Temp src Pulse Resp  05/06/23 0041 113/68 -- -- 98 --  05/05/23 2150 118/61 98.2 F (36.8 C) Oral 98 16    Constitutional: Well-developed, obese  female in no acute distress.  Cardiovascular: normal rate & rhythm, warm and well-perfused Respiratory: normal effort, no problems with respiration noted GI: Abd soft, non-tender, no evidence of trauma  MS: Extremities nontender, no edema, normal ROM, Right knee with mild abrasions, no  lacerations, no erythema, no evidence of fracture with full ROM Neurologic: Alert and oriented x 4.  GU: no CVA tenderness Pelvic: Deferred     Fetal Tracing: Cat 1 reactive at 2300 ( 4 hours of monitoring after incident occurred) Baseline: 125-130 Variability:moderate  Accelerations: present Decelerations: absent Toco: no ctx   Labs: Results for orders placed or performed during the hospital encounter of 05/05/23 (from the past 24 hours)  CBC  Status: Abnormal   Collection Time: 05/05/23 11:11 PM  Result Value Ref Range   WBC 12.4 (H) 4.0 - 10.5 K/uL   RBC 3.91 3.87 - 5.11 MIL/uL   Hemoglobin 11.6 (L) 12.0 - 15.0 g/dL   HCT 21.3 (L) 08.6 - 57.8 %   MCV 87.0 80.0 - 100.0 fL   MCH 29.7 26.0 - 34.0 pg   MCHC 34.1 30.0 - 36.0 g/dL   RDW 46.9 62.9 - 52.8 %   Platelets 179 150 - 400 K/uL   nRBC 0.0 0.0 - 0.2 %  Comprehensive metabolic panel     Status: Abnormal   Collection Time: 05/05/23 11:11 PM  Result Value Ref Range   Sodium 135 135 - 145 mmol/L   Potassium 3.7 3.5 - 5.1 mmol/L   Chloride 104 98 - 111 mmol/L   CO2 23 22 - 32 mmol/L   Glucose, Bld 96 70 - 99 mg/dL   BUN 7 6 - 20 mg/dL   Creatinine, Ser 4.13 0.44 - 1.00 mg/dL   Calcium 8.9 8.9 - 24.4 mg/dL   Total Protein 6.5 6.5 - 8.1 g/dL   Albumin 3.0  (L) 3.5 - 5.0 g/dL   AST 24 15 - 41 U/L   ALT 18 0 - 44 U/L   Alkaline Phosphatase 88 38 - 126 U/L   Total Bilirubin 0.4 0.0 - 1.2 mg/dL   GFR, Estimated >01 >02 mL/min   Anion gap 8 5 - 15    MDM & MAU COURSE  MDM:  HIGH  Trauma in pregnancy  NST x 4 hours CBC, CMP, KB, U/A ordered  Reassuring fetal testing at this time - Will plan for discharge  MAU Course: Orders Placed This Encounter  Procedures   Urinalysis, Routine w reflex microscopic -Urine, Clean Catch   CBC   Comprehensive metabolic panel   Kleihauer-Betke stain   Discharge patient Discharge disposition: 01-Home or Self Care; Discharge patient date: 05/06/2023     I have reviewed the patient chart and performed the physical exam . I have ordered & interpreted the lab results and reviewed and interpreted the NST Medications ordered as stated below.  A/P as described below.  Counseling and education provided and patient agreeable  with plan as described below. Verbalized understanding.    ASSESSMENT   1. Traumatic injury during pregnancy in third trimester   2. NST (non-stress test) reactive on fetal surveillance   3. [redacted] weeks gestation of pregnancy     PLAN  Discharge home in stable condition with return precautions.   See AVS for full description of information given to the patient including both verbal and written. Patient verbalized understanding and agrees with the plan as described above.     Allergies as of 05/06/2023       Reactions   Heparin  Hives   Shellfish Allergy  Anaphylaxis   Latex Hives   Meloxicam Swelling        Medication List     TAKE these medications    aspirin EC 81 MG tablet Take 81 mg by mouth daily. Swallow whole.   enoxaparin  40 MG/0.4ML injection Commonly known as: Lovenox  Inject 0.4 mLs (40 mg total) into the skin daily.   enoxaparin  40 MG/0.4ML injection Commonly known as: Lovenox  Inject 0.4 mL every day by subcutaneous route as directed.   metoCLOPramide  10  MG tablet Commonly known as: REGLAN  Take 1 tablet (10 mg total) by mouth every 6 (six) hours.   multivitamin-prenatal 27-0.8 MG Tabs tablet  Take 1 tablet by mouth daily at 12 noon. With dhea   ondansetron  4 MG disintegrating tablet Commonly known as: ZOFRAN -ODT Take 1 tablet (4 mg total) by mouth every 8 (eight) hours as needed for nausea or vomiting.   ondansetron  4 MG disintegrating tablet Commonly known as: ZOFRAN -ODT Place 1 tablet (4 mg total) 2 (two) times daily sublingually   VITAMIN D PO Take by mouth daily.        Debbe Fail, MSN, [facility], [facility] [employer]

## 2023-05-06 DIAGNOSIS — Z3A29 29 weeks gestation of pregnancy: Secondary | ICD-10-CM

## 2023-05-06 DIAGNOSIS — O9A213 Injury, poisoning and certain other consequences of external causes complicating pregnancy, third trimester: Secondary | ICD-10-CM | POA: Diagnosis not present

## 2023-05-06 DIAGNOSIS — Z3689 Encounter for other specified antenatal screening: Secondary | ICD-10-CM

## 2023-05-06 LAB — KLEIHAUER-BETKE STAIN
Fetal Cells %: 0 %
Quantitation Fetal Hemoglobin: 0 mL

## 2023-05-06 NOTE — Discharge Instructions (Signed)
 Watch for any contractions or vaginal bleeding over the next 24 hours Follow up with your OB provider

## 2023-05-10 DIAGNOSIS — M9905 Segmental and somatic dysfunction of pelvic region: Secondary | ICD-10-CM | POA: Diagnosis not present

## 2023-05-10 DIAGNOSIS — M9903 Segmental and somatic dysfunction of lumbar region: Secondary | ICD-10-CM | POA: Diagnosis not present

## 2023-05-10 DIAGNOSIS — M5441 Lumbago with sciatica, right side: Secondary | ICD-10-CM | POA: Diagnosis not present

## 2023-05-10 DIAGNOSIS — M25551 Pain in right hip: Secondary | ICD-10-CM | POA: Diagnosis not present

## 2023-05-16 ENCOUNTER — Ambulatory Visit (HOSPITAL_COMMUNITY): Attending: Cardiology

## 2023-05-16 DIAGNOSIS — Z3A30 30 weeks gestation of pregnancy: Secondary | ICD-10-CM | POA: Diagnosis not present

## 2023-05-16 DIAGNOSIS — O99113 Other diseases of the blood and blood-forming organs and certain disorders involving the immune mechanism complicating pregnancy, third trimester: Secondary | ICD-10-CM | POA: Diagnosis not present

## 2023-05-16 DIAGNOSIS — O09813 Supervision of pregnancy resulting from assisted reproductive technology, third trimester: Secondary | ICD-10-CM | POA: Diagnosis not present

## 2023-05-16 DIAGNOSIS — R55 Syncope and collapse: Secondary | ICD-10-CM | POA: Insufficient documentation

## 2023-05-16 DIAGNOSIS — M9903 Segmental and somatic dysfunction of lumbar region: Secondary | ICD-10-CM | POA: Diagnosis not present

## 2023-05-16 DIAGNOSIS — M25551 Pain in right hip: Secondary | ICD-10-CM | POA: Diagnosis not present

## 2023-05-16 DIAGNOSIS — Z23 Encounter for immunization: Secondary | ICD-10-CM | POA: Diagnosis not present

## 2023-05-16 DIAGNOSIS — M9905 Segmental and somatic dysfunction of pelvic region: Secondary | ICD-10-CM | POA: Diagnosis not present

## 2023-05-16 DIAGNOSIS — M5441 Lumbago with sciatica, right side: Secondary | ICD-10-CM | POA: Diagnosis not present

## 2023-05-16 LAB — ECHOCARDIOGRAM COMPLETE
Area-P 1/2: 4.18 cm2
S' Lateral: 3 cm

## 2023-05-17 ENCOUNTER — Other Ambulatory Visit (HOSPITAL_BASED_OUTPATIENT_CLINIC_OR_DEPARTMENT_OTHER): Payer: Self-pay

## 2023-05-22 ENCOUNTER — Ambulatory Visit: Payer: Self-pay | Admitting: Cardiology

## 2023-05-24 DIAGNOSIS — M9905 Segmental and somatic dysfunction of pelvic region: Secondary | ICD-10-CM | POA: Diagnosis not present

## 2023-05-24 DIAGNOSIS — M9903 Segmental and somatic dysfunction of lumbar region: Secondary | ICD-10-CM | POA: Diagnosis not present

## 2023-05-24 DIAGNOSIS — M5441 Lumbago with sciatica, right side: Secondary | ICD-10-CM | POA: Diagnosis not present

## 2023-05-24 DIAGNOSIS — M25551 Pain in right hip: Secondary | ICD-10-CM | POA: Diagnosis not present

## 2023-05-25 ENCOUNTER — Ambulatory Visit (INDEPENDENT_AMBULATORY_CARE_PROVIDER_SITE_OTHER): Payer: Self-pay | Admitting: Pediatrics

## 2023-05-25 DIAGNOSIS — R55 Syncope and collapse: Secondary | ICD-10-CM

## 2023-05-25 DIAGNOSIS — Z7681 Expectant parent(s) prebirth pediatrician visit: Secondary | ICD-10-CM

## 2023-05-25 NOTE — Progress Notes (Signed)
 Prenatal counseling for impending newborn done Parent agrees to vaccine and office policies [phone]

## 2023-05-29 ENCOUNTER — Other Ambulatory Visit (HOSPITAL_BASED_OUTPATIENT_CLINIC_OR_DEPARTMENT_OTHER): Payer: Self-pay

## 2023-05-29 MED ORDER — PANTOPRAZOLE SODIUM 40 MG PO TBEC
40.0000 mg | DELAYED_RELEASE_TABLET | Freq: Every day | ORAL | 1 refills | Status: DC
  Filled 2023-05-29: qty 60, 60d supply, fill #0

## 2023-06-01 ENCOUNTER — Ambulatory Visit (INDEPENDENT_AMBULATORY_CARE_PROVIDER_SITE_OTHER): Admitting: Cardiology

## 2023-06-01 ENCOUNTER — Encounter: Payer: Self-pay | Admitting: Cardiology

## 2023-06-01 ENCOUNTER — Other Ambulatory Visit (HOSPITAL_BASED_OUTPATIENT_CLINIC_OR_DEPARTMENT_OTHER): Payer: Self-pay

## 2023-06-01 VITALS — BP 104/72 | HR 92 | Ht 64.0 in | Wt 205.0 lb

## 2023-06-01 DIAGNOSIS — R002 Palpitations: Secondary | ICD-10-CM | POA: Diagnosis not present

## 2023-06-01 DIAGNOSIS — R42 Dizziness and giddiness: Secondary | ICD-10-CM

## 2023-06-01 DIAGNOSIS — Z3A33 33 weeks gestation of pregnancy: Secondary | ICD-10-CM | POA: Diagnosis not present

## 2023-06-01 MED ORDER — PROPRANOLOL HCL 10 MG PO TABS
10.0000 mg | ORAL_TABLET | Freq: Two times a day (BID) | ORAL | 2 refills | Status: AC | PRN
  Filled 2023-06-01: qty 30, 15d supply, fill #0

## 2023-06-01 NOTE — Patient Instructions (Addendum)
 Medication Instructions:  Your physician has recommended you make the following change in your medication:  1-Propanolol 10 mg every 12 hours as needed if your heart rate is above 150 for 5 minutes, please take with at least 8 ounces of water. 2- It is okay to eat 1 bag of Lays potato chips a day.  *If you need a refill on your cardiac medications before your next appointment, please call your pharmacy*  Lab Work: If you have labs (blood work) drawn today and your tests are completely normal, you will receive your results only by: MyChart Message (if you have MyChart) OR A paper copy in the mail If you have any lab test that is abnormal or we need to change your treatment, we will call you to review the results.  Testing/Procedures: None ordered today.  Follow-Up: At [facility], you and your health needs are our priority.  As part of our continuing mission to provide you with exceptional heart care, our providers are all part of one team.  This team includes your primary Cardiologist (physician) and Advanced Practice Providers or APPs (Physician Assistants and Nurse Practitioners) who all work together to provide you with the care you need, when you need it.  Your next appointment:   16 week(s) virtual visit  Provider:   Dr. Henrine Logan    We recommend signing up for the patient portal called "MyChart".  Sign up information is provided on this After Visit Summary.  MyChart is used to connect with patients for Virtual Visits (Telemedicine).  Patients are able to view lab/test results, encounter notes, upcoming appointments, etc.  Non-urgent messages can be sent to your provider as well.   To learn more about what you can do with MyChart, go to [email or web address].

## 2023-06-01 NOTE — Progress Notes (Signed)
 Cardio-Obstetrics Clinic  New Evaluation  Date:  06/02/2023   ID:  Brooke Sharp, DOB 1995/11/16, MRN [id number]  PCP:  Pcp, No   [facility] Providers Cardiologist:  None  Electrophysiologist:  None       Referring MD: Terri Fester, MD   Chief Complaint: " I am ok"  History of Present Illness:    Brooke Sharp is a 28 y.o. female [G4P0030] who is being seen today for the evaluation of dizzines at the request of Terri Fester, MD.   Patient with  suspected POTS who presents with episodes of dizziness and palpitations.  Dizziness and palpitations occur frequently, especially when standing for over 20 minutes. These episodes are accompanied by a sensation of the heart beating harder, feeling hot, and a sense of impending fainting. Episodes can last from hours to more than a day, with palpitations subsiding upon sitting. Blood pressure during episodes is low, around 94/60 mmHg. A heart monitor was previously used due to heart rate spikes during physical activity, causing visual disturbances and near-fainting. A severe episode in April involved heart pounding, pallor, and near-fainting, requiring assistance to sit. Her face often becomes pale during these episodes.  Prior CV Studies Reviewed: The following studies were reviewed today: Zio montior, echo  Past Medical History:  Diagnosis Date   Adjustment disorder with mixed anxiety and depressed mood 02/25/2020   COVID 09/2018   weakness, sob muscle cramps x 2 weeks all symptoms resolved   History of palpitations in adulthood    summer 2021, wore heart monitor from pcp dr Asa Lauth [facility], issue resolved   Wears glasses 06/24/2020    Past Surgical History:  Procedure Laterality Date   left shoulder surgery Left 2017   labral tear   WISDOM TOOTH EXTRACTION  2017      OB History     Gravida  4   Para      Term      Preterm      AB  3   Living          SAB  3   IAB      Ectopic      Multiple      Live Births                  Current Medications: Current Meds  Medication Sig   aspirin EC 81 MG tablet Take 81 mg by mouth daily. Swallow whole.   enoxaparin  (LOVENOX ) 40 MG/0.4ML injection Inject 0.4 mL every day by subcutaneous route as directed.   pantoprazole (PROTONIX) 40 MG tablet Take 1 tablet (40 mg total) by mouth daily.   Prenatal Vit-Fe Fumarate-FA (MULTIVITAMIN-PRENATAL) 27-0.8 MG TABS tablet Take 1 tablet by mouth daily at 12 noon. With dhea   propranolol (INDERAL) 10 MG tablet Take 1 tablet (10 mg total) by mouth every 12 (twelve) hours as needed (for a heart rate over 150 for 5 minutes, drink at least 8 oz of water with this tablet).   [DISCONTINUED] enoxaparin  (LOVENOX ) 40 MG/0.4ML injection Inject 0.4 mLs (40 mg total) into the skin daily.     Allergies:   Heparin , Shellfish allergy , Latex, and Meloxicam   Social History   Socioeconomic History   Marital status: Married    Spouse name: Not on file   Number of children: Not on file   Years of education: Not on file   Highest education level: Not on file  Occupational History   Not  on file  Tobacco Use   Smoking status: Never   Smokeless tobacco: Never  Vaping Use   Vaping status: Never Used  Substance and Sexual Activity   Alcohol use: Not Currently    Comment: occ   Drug use: Not Currently    Comment: experimental use of marijuana 7 years ago   Sexual activity: Yes    Birth control/protection: None  Other Topics Concern   Not on file  Social History Narrative   Not on file   Social Drivers of Health   Financial Resource Strain: Not on file  Food Insecurity: Not on file  Transportation Needs: Not on file  Physical Activity: Not on file  Stress: Not on file  Social Connections: Not on file      Family History  Problem Relation Age of Onset   Depression Mother    Depression Paternal Grandmother    Drug abuse Paternal Grandmother        ROS:   Please see the history of present illness.    Dizziness and palpitations All other systems reviewed and are negative.   Labs/EKG Reviewed:    EKG:   EKG was not ordered today.   Recent Labs: 05/05/2023: ALT 18; BUN 7; Creatinine, Ser 0.50; Hemoglobin 11.6; Platelets 179; Potassium 3.7; Sodium 135   Recent Lipid Panel No results found for: "CHOL", "TRIG", "HDL", "CHOLHDL", "LDLCALC", "LDLDIRECT"  Physical Exam:    VS:  BP 104/72 (BP Location: Left Arm, Patient Position: Sitting)   Pulse 92   Ht 5\' 4"  (1.626 m)   Wt 205 lb (93 kg)   LMP 04/20/2022 (Approximate)   SpO2 97%   BMI 35.19 kg/m     Wt Readings from Last 3 Encounters:  06/01/23 205 lb (93 kg)  02/18/23 207 lb (93.9 kg)  06/29/20 210 lb 3.2 oz (95.3 kg)     GEN:  Well nourished, well developed in no acute distress HEENT: Normal NECK: No JVD; No carotid bruits LYMPHATICS: No lymphadenopathy CARDIAC: RRR, no murmurs, rubs, gallops RESPIRATORY:  Clear to auscultation without rales, wheezing or rhonchi  ABDOMEN: Soft, non-tender, non-distended MUSCULOSKELETAL:  No edema; No deformity  SKIN: Warm and dry NEUROLOGIC:  Alert and oriented x 3 PSYCHIATRIC:  Normal affect    Risk Assessment/Risk Calculators:     CARPREG II Risk Prediction Index Score:  1.  The patient's risk for a primary cardiac event is 5%.   Modified World Health Organization [facility]) Classification of Maternal CV Risk   Class I         ASSESSMENT & PLAN:    Palpitations and dizziness Intermittent palpitations and dizziness suggest orthostatic hypotension, possibly POTS. Symptoms include heart pounding, dizziness, and near-syncope, exacerbated by prolonged standing and exertion. Heart monitor showed skipped beats; echocardiogram normal. Symptoms since second trimester of pregnancy. - Increase salt intake. - Prescribe propranolol 10 mg as needed for palpitations, with 8 ounces of water and sitting down. Consider 5 mg if  uncomfortable with 10 mg. - Monitor symptoms postpartum; consider treadmill test if persistent.  - Ensure hydration with 80-100 ounces of water daily.  Low iron levels Iron level at 11.7 g/dL, contributing to dizziness and weakness. Current prenatal vitamin may lack iron. - Switch to prenatal vitamin with iron if current lacks it. - Increase fruits and vegetables to prevent constipation from iron supplementation.  Patient Instructions  Medication Instructions:  Your physician has recommended you make the following change in your medication:  1-Propanolol 10 mg every 12 hours  as needed if your heart rate is above 150 for 5 minutes, please take with at least 8 ounces of water. 2- It is okay to eat 1 bag of Lays potato chips a day.  *If you need a refill on your cardiac medications before your next appointment, please call your pharmacy*  Lab Work: If you have labs (blood work) drawn today and your tests are completely normal, you will receive your results only by: MyChart Message (if you have MyChart) OR A paper copy in the mail If you have any lab test that is abnormal or we need to change your treatment, we will call you to review the results.  Testing/Procedures: None ordered today.  Follow-Up: At [facility], you and your health needs are our priority.  As part of our continuing mission to provide you with exceptional heart care, our providers are all part of one team.  This team includes your primary Cardiologist (physician) and Advanced Practice Providers or APPs (Physician Assistants and Nurse Practitioners) who all work together to provide you with the care you need, when you need it.  Your next appointment:   16 week(s) virtual visit  Provider:   Dr. Henrine Logan    We recommend signing up for the patient portal called "MyChart".  Sign up information is provided on this After Visit Summary.  MyChart is used to connect with patients for Virtual Visits (Telemedicine).   Patients are able to view lab/test results, encounter notes, upcoming appointments, etc.  Non-urgent messages can be sent to your provider as well.   To learn more about what you can do with MyChart, go to [email or web address].          Dispo:  No follow-ups on file.   Medication Adjustments/Labs and Tests Ordered: Current medicines are reviewed at length with the patient today.  Concerns regarding medicines are outlined above.  Tests Ordered: No orders of the defined types were placed in this encounter.  Medication Changes: Meds ordered this encounter  Medications   propranolol (INDERAL) 10 MG tablet    Sig: Take 1 tablet (10 mg total) by mouth every 12 (twelve) hours as needed (for a heart rate over 150 for 5 minutes, drink at least 8 oz of water with this tablet).    Dispense:  30 tablet    Refill:  2

## 2023-06-05 NOTE — Telephone Encounter (Signed)
 Results/message from Dr. Emmette Harms has been released to MyChart. A letter is being sent to the last known home address.

## 2023-06-07 DIAGNOSIS — M25551 Pain in right hip: Secondary | ICD-10-CM | POA: Diagnosis not present

## 2023-06-07 DIAGNOSIS — M5441 Lumbago with sciatica, right side: Secondary | ICD-10-CM | POA: Diagnosis not present

## 2023-06-07 DIAGNOSIS — M9905 Segmental and somatic dysfunction of pelvic region: Secondary | ICD-10-CM | POA: Diagnosis not present

## 2023-06-07 DIAGNOSIS — M9903 Segmental and somatic dysfunction of lumbar region: Secondary | ICD-10-CM | POA: Diagnosis not present

## 2023-06-12 DIAGNOSIS — M25551 Pain in right hip: Secondary | ICD-10-CM | POA: Diagnosis not present

## 2023-06-12 DIAGNOSIS — M9903 Segmental and somatic dysfunction of lumbar region: Secondary | ICD-10-CM | POA: Diagnosis not present

## 2023-06-12 DIAGNOSIS — M5441 Lumbago with sciatica, right side: Secondary | ICD-10-CM | POA: Diagnosis not present

## 2023-06-12 DIAGNOSIS — M9905 Segmental and somatic dysfunction of pelvic region: Secondary | ICD-10-CM | POA: Diagnosis not present

## 2023-06-15 ENCOUNTER — Other Ambulatory Visit (HOSPITAL_BASED_OUTPATIENT_CLINIC_OR_DEPARTMENT_OTHER): Payer: Self-pay

## 2023-06-15 DIAGNOSIS — O99113 Other diseases of the blood and blood-forming organs and certain disorders involving the immune mechanism complicating pregnancy, third trimester: Secondary | ICD-10-CM | POA: Diagnosis not present

## 2023-06-15 DIAGNOSIS — O09813 Supervision of pregnancy resulting from assisted reproductive technology, third trimester: Secondary | ICD-10-CM | POA: Diagnosis not present

## 2023-06-15 DIAGNOSIS — Z3685 Encounter for antenatal screening for Streptococcus B: Secondary | ICD-10-CM | POA: Diagnosis not present

## 2023-06-15 DIAGNOSIS — Z3A35 35 weeks gestation of pregnancy: Secondary | ICD-10-CM | POA: Diagnosis not present

## 2023-06-15 MED ORDER — ENOXAPARIN SODIUM 40 MG/0.4ML IJ SOSY
40.0000 mg | PREFILLED_SYRINGE | Freq: Every day | INTRAMUSCULAR | 0 refills | Status: DC
  Filled 2023-06-15: qty 2.8, 7d supply, fill #0

## 2023-06-19 DIAGNOSIS — M9903 Segmental and somatic dysfunction of lumbar region: Secondary | ICD-10-CM | POA: Diagnosis not present

## 2023-06-19 DIAGNOSIS — M5441 Lumbago with sciatica, right side: Secondary | ICD-10-CM | POA: Diagnosis not present

## 2023-06-19 DIAGNOSIS — M9905 Segmental and somatic dysfunction of pelvic region: Secondary | ICD-10-CM | POA: Diagnosis not present

## 2023-06-19 DIAGNOSIS — M25551 Pain in right hip: Secondary | ICD-10-CM | POA: Diagnosis not present

## 2023-06-21 ENCOUNTER — Other Ambulatory Visit (HOSPITAL_BASED_OUTPATIENT_CLINIC_OR_DEPARTMENT_OTHER): Payer: Self-pay

## 2023-06-22 DIAGNOSIS — O09813 Supervision of pregnancy resulting from assisted reproductive technology, third trimester: Secondary | ICD-10-CM | POA: Diagnosis not present

## 2023-06-22 DIAGNOSIS — Z3A36 36 weeks gestation of pregnancy: Secondary | ICD-10-CM | POA: Diagnosis not present

## 2023-06-26 DIAGNOSIS — M25551 Pain in right hip: Secondary | ICD-10-CM | POA: Diagnosis not present

## 2023-06-26 DIAGNOSIS — M9903 Segmental and somatic dysfunction of lumbar region: Secondary | ICD-10-CM | POA: Diagnosis not present

## 2023-06-26 DIAGNOSIS — M9905 Segmental and somatic dysfunction of pelvic region: Secondary | ICD-10-CM | POA: Diagnosis not present

## 2023-06-26 DIAGNOSIS — M5441 Lumbago with sciatica, right side: Secondary | ICD-10-CM | POA: Diagnosis not present

## 2023-06-29 DIAGNOSIS — O09813 Supervision of pregnancy resulting from assisted reproductive technology, third trimester: Secondary | ICD-10-CM | POA: Diagnosis not present

## 2023-06-29 DIAGNOSIS — Z3A37 37 weeks gestation of pregnancy: Secondary | ICD-10-CM | POA: Diagnosis not present

## 2023-06-29 DIAGNOSIS — O99113 Other diseases of the blood and blood-forming organs and certain disorders involving the immune mechanism complicating pregnancy, third trimester: Secondary | ICD-10-CM | POA: Diagnosis not present

## 2023-07-02 ENCOUNTER — Other Ambulatory Visit: Payer: Self-pay

## 2023-07-02 ENCOUNTER — Encounter (HOSPITAL_COMMUNITY): Payer: Self-pay | Admitting: Obstetrics and Gynecology

## 2023-07-02 ENCOUNTER — Inpatient Hospital Stay (HOSPITAL_COMMUNITY): Admitting: Anesthesiology

## 2023-07-02 ENCOUNTER — Inpatient Hospital Stay (HOSPITAL_COMMUNITY)
Admission: AD | Admit: 2023-07-02 | Discharge: 2023-07-04 | DRG: 806 | Disposition: A | Discharge status: Home / Self Care | Attending: Obstetrics and Gynecology | Admitting: Obstetrics and Gynecology

## 2023-07-02 DIAGNOSIS — Z1589 Genetic susceptibility to other disease: Secondary | ICD-10-CM

## 2023-07-02 DIAGNOSIS — O99214 Obesity complicating childbirth: Secondary | ICD-10-CM | POA: Diagnosis not present

## 2023-07-02 DIAGNOSIS — K219 Gastro-esophageal reflux disease without esophagitis: Secondary | ICD-10-CM | POA: Diagnosis not present

## 2023-07-02 DIAGNOSIS — O4292 Full-term premature rupture of membranes, unspecified as to length of time between rupture and onset of labor: Principal | ICD-10-CM | POA: Diagnosis present

## 2023-07-02 DIAGNOSIS — Z8616 Personal history of COVID-19: Secondary | ICD-10-CM

## 2023-07-02 DIAGNOSIS — O4290 Premature rupture of membranes, unspecified as to length of time between rupture and onset of labor, unspecified weeks of gestation: Secondary | ICD-10-CM | POA: Diagnosis present

## 2023-07-02 DIAGNOSIS — Z412 Encounter for routine and ritual male circumcision: Secondary | ICD-10-CM | POA: Diagnosis not present

## 2023-07-02 DIAGNOSIS — D688 Other specified coagulation defects: Secondary | ICD-10-CM | POA: Diagnosis not present

## 2023-07-02 DIAGNOSIS — Z7901 Long term (current) use of anticoagulants: Secondary | ICD-10-CM

## 2023-07-02 DIAGNOSIS — S3141XA Laceration without foreign body of vagina and vulva, initial encounter: Secondary | ICD-10-CM | POA: Diagnosis not present

## 2023-07-02 DIAGNOSIS — O9962 Diseases of the digestive system complicating childbirth: Secondary | ICD-10-CM | POA: Diagnosis present

## 2023-07-02 DIAGNOSIS — O9912 Other diseases of the blood and blood-forming organs and certain disorders involving the immune mechanism complicating childbirth: Secondary | ICD-10-CM | POA: Diagnosis present

## 2023-07-02 DIAGNOSIS — Z3A37 37 weeks gestation of pregnancy: Secondary | ICD-10-CM | POA: Diagnosis not present

## 2023-07-02 DIAGNOSIS — O26893 Other specified pregnancy related conditions, third trimester: Secondary | ICD-10-CM | POA: Diagnosis present

## 2023-07-02 LAB — OB RESULTS CONSOLE ABO/RH: RH Type: POSITIVE

## 2023-07-02 LAB — OB RESULTS CONSOLE ANTIBODY SCREEN: Antibody Screen: NEGATIVE

## 2023-07-02 LAB — OB RESULTS CONSOLE GC/CHLAMYDIA
Chlamydia: NEGATIVE
Neisseria Gonorrhea: NEGATIVE

## 2023-07-02 LAB — POCT FERN TEST
POCT Fern Test: NEGATIVE
POCT Fern Test: POSITIVE

## 2023-07-02 LAB — HEPATITIS C ANTIBODY: HCV Ab: NEGATIVE

## 2023-07-02 LAB — OB RESULTS CONSOLE HIV ANTIBODY (ROUTINE TESTING): HIV: NONREACTIVE

## 2023-07-02 LAB — CBC
HCT: 38 % (ref 36.0–46.0)
Hemoglobin: 12.9 g/dL (ref 12.0–15.0)
MCH: 29.3 pg (ref 26.0–34.0)
MCHC: 33.9 g/dL (ref 30.0–36.0)
MCV: 86.4 fL (ref 80.0–100.0)
Platelets: 180 10*3/uL (ref 150–400)
RBC: 4.4 MIL/uL (ref 3.87–5.11)
RDW: 13.1 % (ref 11.5–15.5)
WBC: 9.6 10*3/uL (ref 4.0–10.5)
nRBC: 0 % (ref 0.0–0.2)

## 2023-07-02 LAB — TYPE AND SCREEN
ABO/RH(D): A POS
Antibody Screen: NEGATIVE

## 2023-07-02 LAB — OB RESULTS CONSOLE RPR: RPR: NONREACTIVE

## 2023-07-02 LAB — OB RESULTS CONSOLE HEPATITIS B SURFACE ANTIGEN: Hepatitis B Surface Ag: NEGATIVE

## 2023-07-02 LAB — OB RESULTS CONSOLE GBS: GBS: NEGATIVE

## 2023-07-02 LAB — OB RESULTS CONSOLE RUBELLA ANTIBODY, IGM: Rubella: IMMUNE

## 2023-07-02 MED ORDER — OXYCODONE-ACETAMINOPHEN 5-325 MG PO TABS: 1.0000 | ORAL_TABLET | ORAL | Status: DC | PRN

## 2023-07-02 MED ORDER — EPHEDRINE 5 MG/ML INJ: 10.0000 mg | INTRAVENOUS | Status: DC | PRN

## 2023-07-02 MED ORDER — OXYTOCIN BOLUS FROM INFUSION
333.0000 mL | Freq: Once | INTRAVENOUS | Status: AC
  Administered 2023-07-03: 333 mL via INTRAVENOUS

## 2023-07-02 MED ORDER — OXYTOCIN-SODIUM CHLORIDE 30-0.9 UT/500ML-% IV SOLN
1.0000 m[IU]/min | INTRAVENOUS | Status: DC
  Administered 2023-07-02: 2 m[IU]/min via INTRAVENOUS
  Filled 2023-07-02: qty 500

## 2023-07-02 MED ORDER — OXYTOCIN-SODIUM CHLORIDE 30-0.9 UT/500ML-% IV SOLN
1.0000 m[IU]/min | INTRAVENOUS | Status: DC
  Administered 2023-07-02: 2 m[IU]/min via INTRAVENOUS

## 2023-07-02 MED ORDER — PHENYLEPHRINE 80 MCG/ML (10ML) SYRINGE FOR IV PUSH (FOR BLOOD PRESSURE SUPPORT): 80.0000 ug | PREFILLED_SYRINGE | INTRAVENOUS | Status: DC | PRN

## 2023-07-02 MED ORDER — OXYCODONE-ACETAMINOPHEN 5-325 MG PO TABS: 2.0000 | ORAL_TABLET | ORAL | Status: DC | PRN

## 2023-07-02 MED ORDER — ONDANSETRON HCL 4 MG/2ML IJ SOLN
4.0000 mg | Freq: Four times a day (QID) | INTRAMUSCULAR | Status: DC | PRN
  Administered 2023-07-02: 4 mg via INTRAVENOUS
  Filled 2023-07-02: qty 2

## 2023-07-02 MED ORDER — LACTATED RINGERS IV SOLN: 500.0000 mL | INTRAVENOUS | Status: DC | PRN

## 2023-07-02 MED ORDER — MISOPROSTOL 50MCG HALF TABLET
50.0000 ug | ORAL_TABLET | ORAL | Status: DC | PRN
  Administered 2023-07-02: 50 ug via ORAL
  Filled 2023-07-02: qty 1

## 2023-07-02 MED ORDER — LACTATED RINGERS IV SOLN: INTRAVENOUS | Status: DC

## 2023-07-02 MED ORDER — FENTANYL-BUPIVACAINE-NACL 0.5-0.125-0.9 MG/250ML-% EP SOLN
12.0000 mL/h | EPIDURAL | Status: DC | PRN
  Administered 2023-07-02: 12 mL/h via EPIDURAL
  Filled 2023-07-02: qty 250

## 2023-07-02 MED ORDER — LACTATED RINGERS IV SOLN: 500.0000 mL | Freq: Once | INTRAVENOUS | Status: DC

## 2023-07-02 MED ORDER — TERBUTALINE SULFATE 1 MG/ML IJ SOLN: 0.2500 mg | Freq: Once | INTRAMUSCULAR | Status: DC | PRN

## 2023-07-02 MED ORDER — LIDOCAINE HCL (PF) 1 % IJ SOLN: 30.0000 mL | INTRAMUSCULAR | Status: DC | PRN

## 2023-07-02 MED ORDER — FLEET ENEMA RE ENEM
1.0000 | ENEMA | RECTAL | Status: DC | PRN
Start: 2023-07-02 — End: 2023-07-03

## 2023-07-02 MED ORDER — OXYTOCIN-SODIUM CHLORIDE 30-0.9 UT/500ML-% IV SOLN: 2.5000 [IU]/h | INTRAVENOUS | Status: DC

## 2023-07-02 MED ORDER — SOD CITRATE-CITRIC ACID 500-334 MG/5ML PO SOLN: 30.0000 mL | ORAL | Status: DC | PRN

## 2023-07-02 MED ORDER — DIPHENHYDRAMINE HCL 50 MG/ML IJ SOLN
12.5000 mg | INTRAMUSCULAR | Status: DC | PRN
  Administered 2023-07-02 (×2): 12.5 mg via INTRAVENOUS
  Filled 2023-07-02: qty 1

## 2023-07-02 MED ORDER — LACTATED RINGERS IV SOLN
500.0000 mL | Freq: Once | INTRAVENOUS | Status: AC
Start: 2023-07-02 — End: 2023-07-02
  Administered 2023-07-02: 500 mL via INTRAVENOUS

## 2023-07-02 MED ORDER — ACETAMINOPHEN 325 MG PO TABS: 650.0000 mg | ORAL_TABLET | ORAL | Status: DC | PRN

## 2023-07-02 MED ORDER — LIDOCAINE HCL (PF) 1 % IJ SOLN
INTRAMUSCULAR | Status: DC | PRN
  Administered 2023-07-02: 4 mL via EPIDURAL
  Administered 2023-07-02: 5 mL via EPIDURAL

## 2023-07-02 MED ORDER — PHENYLEPHRINE 80 MCG/ML (10ML) SYRINGE FOR IV PUSH (FOR BLOOD PRESSURE SUPPORT)
80.0000 ug | PREFILLED_SYRINGE | INTRAVENOUS | Status: DC | PRN
Start: 2023-07-02 — End: 2023-07-03

## 2023-07-02 NOTE — Progress Notes (Signed)
 S: Comfortable with epidural. Husband, Eva, present and supportive.   O: Vitals:   07/02/23 1802 07/02/23 1808 07/02/23 1832 07/02/23 1900  BP: 132/80 123/74 130/82 (!) 98/53  Pulse: 99 91 84 73  Resp:      Temp:      TempSrc:      SpO2:      Weight:      Height:       FHT:  FHR: 130 bpm, variability: moderate,  accelerations:  Present,  decelerations:  Absent UC:   regular, every 2-3 minutes SVE:   Dilation: 3.5 Effacement (%): 80, 90 Station: 0 Exam by: A. Joshua, CNM  IUPC placed without difficulty after patient consent.   A / P: Augmentation of labor, progressing well  Fetal Wellbeing:  Category I GBS: Negative Pain Control:  Epidural Anticipated MOD:  NSVD  Titrate Pitocin 2x2 until adequate MVUs. Recheck SVE 4 hours after adequate MVUs are achieved.   Alan MARLA Joshua, CNM, MSN 07/02/2023, 7:40 PM

## 2023-07-02 NOTE — MAU Provider Note (Signed)
 History     CSN: [id number]  Arrival date and time: 07/02/23 9198   Event Date/Time   First Provider Initiated Contact with Patient 07/02/23 [phone]      Chief Complaint  Patient presents with   Rupture of Membranes   HPI  Brooke Sharp is a 28 y.o. G4P0030 at [redacted]w[redacted]d who presents for evaluation of possible rupture of membranes. Patient reports soaking through two pairs of underwear this morning prior to arrival and has felt trickles of fluid when coughing/laughing since then. She reports intercourse last night using the withdrawal method.    She denies any vaginal bleeding. Reports normal fetal movement.   OB History     Gravida  4   Para      Term      Preterm      AB  3   Living         SAB  3   IAB      Ectopic      Multiple      Live Births              Past Medical History:  Diagnosis Date   Adjustment disorder with mixed anxiety and depressed mood 02/25/2020   COVID 09/2018   weakness, sob muscle cramps x 2 weeks all symptoms resolved   History of palpitations in adulthood    summer 2021, wore heart monitor from pcp dr larnell [facility], issue resolved   Wears glasses 06/24/2020    Past Surgical History:  Procedure Laterality Date   Egg Retrieval     left shoulder surgery Left 2017   labral tear   WISDOM TOOTH EXTRACTION  2017    Family History  Problem Relation Age of Onset   Depression Mother    Deep vein thrombosis Mother    Healthy Father    Depression Paternal Grandmother    Drug abuse Paternal Grandmother     Social History   Tobacco Use   Smoking status: Never   Smokeless tobacco: Never  Vaping Use   Vaping status: Never Used  Substance Use Topics   Alcohol use: Not Currently    Comment: occ   Drug use: Not Currently    Comment: experimental use of marijuana 7 years ago    Allergies:  Allergies  Allergen Reactions   Heparin  Hives   Shellfish Allergy  Anaphylaxis   Latex Hives    Meloxicam Swelling    Medications Prior to Admission  Medication Sig Dispense Refill Last Dose/Taking   aspirin EC 81 MG tablet Take 81 mg by mouth daily. Swallow whole.   07/01/2023   pantoprazole  (PROTONIX ) 40 MG tablet Take 1 tablet (40 mg total) by mouth daily. 60 tablet 1 07/01/2023   Prenatal Vit-Fe Fumarate-FA (MULTIVITAMIN-PRENATAL) 27-0.8 MG TABS tablet Take 1 tablet by mouth daily at 12 noon. With dhea   07/01/2023   propranolol  (INDERAL ) 10 MG tablet Take 1 tablet (10 mg total) by mouth every 12 (twelve) hours as needed (for a heart rate over 150 for 5 minutes, drink at least 8 oz of water with this tablet). 30 tablet 2     Review of Systems Physical Exam   Blood pressure 116/76, pulse 98, temperature 98.4 F (36.9 C), temperature source Oral, resp. rate 18, height 5' 4 (1.626 m), weight 95.6 kg, last menstrual period 04/20/2022, SpO2 99%.  Patient Vitals for the past 24 hrs:  BP Temp Temp src Pulse Resp SpO2 Height Weight  07/02/23  9171 116/76 98.4 F (36.9 C) Oral 98 18 99 % -- --  07/02/23 0816 -- -- -- -- -- -- 5' 4 (1.626 m) 95.6 kg    Physical Exam  Fetal Tracing:  Baseline: 135bpm Variability: moderate  Accels: 15x15 Decels: none  Toco: occ  Dilation: 1.5 Effacement (%): 60 Station: 0 Exam by:: Emilio   MAU Course  Procedures  Results for orders placed or performed during the hospital encounter of 07/02/23 (from the past 24 hours)  Fern Test     Status: None   Collection Time: 07/02/23  9:00 AM  Result Value Ref Range   POCT Fern Test Negative = intact amniotic membranes   Fern Test     Status: None   Collection Time: 07/02/23  9:45 AM  Result Value Ref Range   POCT Fern Test Positive = ruptured amniotic membanes       MDM Prenatal records from community office reviewed. Pregnancy complicated by IVF. Labs ordered and reviewed.   Cervical Exam 1.5/60/0 Brooke Sharp initially negative when collected by RN Brooke Sharp collected with speculum exam, small  amount of trickling from the cervix noted during exam. Fern positive Admit for rupture of membranes    Assessment and Plan   1. Full-term premature rupture of membranes, unspecified duration to onset of labor   2. [redacted] weeks gestation of pregnancy    Admit to Labor for SROM  Elenor Mole, [facility] 07/02/2023, 9:45 AM

## 2023-07-02 NOTE — H&P (Signed)
 Brooke Sharp is a 28 y.o. female [phone] [redacted]w[redacted]d presenting for SROM early labor  Patient reports LOF around 6AM today, clear. Some intermittent mild contractions but no consistent pattern. No VB. Good FM throughout.   Prenatal care at [facility] with Dr. Barbette as primary. This is an IVF pregnancy with history of RPL. No PGD done but low risk NIPS. 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 She had routine PNL WNL. Normal fetal anatomy scan. Most recent growth US  performed at 35 weeks showed vertex presentation with EFW 5#6 (31%) and anterior placenta. Normal 1hr GTT 111 and Hgb 11.6 at third trimester screening. GBS screen negative. PMH of POTS takes Propranolol  PRN, has seen Cards. Otherwise healthy.  Patient a/b husband 'Brooke Sharp' and they are expecting a baby boy 'Brooke Sharp'   OB History     Gravida  4   Para      Term      Preterm      AB  3   Living         SAB  3   IAB      Ectopic      Multiple      Live Births  0          Past Medical History:  Diagnosis Date   Adjustment disorder with mixed anxiety and depressed mood 02/25/2020   COVID 09/2018   weakness, sob muscle cramps x 2 weeks all symptoms resolved   History of palpitations in adulthood    summer 2021, wore heart monitor from pcp dr [facility], issue resolved   Wears glasses 06/24/2020   Past Surgical History:  Procedure Laterality Date   Egg Retrieval     left shoulder surgery Left 2017   labral tear   WISDOM TOOTH EXTRACTION  2017   Family History: family history includes Deep vein thrombosis in her mother; Depression in her mother and paternal grandmother; Drug abuse in her paternal grandmother; Healthy in her father. Social History:  reports that she has never smoked. She has never used smokeless tobacco. She reports that she does not currently use alcohol. She reports that she does not currently use drugs.      Maternal Diabetes: No Genetic Screening: Normal Maternal Ultrasounds/Referrals: Normal Fetal Ultrasounds or other Referrals:  None Maternal Substance Abuse:  No Significant Maternal Medications:  None Significant Maternal Lab Results:  Group B Strep negative Other Comments:  None  Review of Systems  All other systems reviewed and are negative.  Per HPI Maternal Exam:  Uterine Assessment: Contraction strength is mild.  Contraction frequency is irregular.  Abdomen: Estimated fetal weight is 6#12.   Fetal presentation: vertex Introitus: Normal vulva. Normal vagina.  Ferning test: positive.  Amniotic fluid character: clear. Pelvis: adequate for delivery.     Fetal Exam Fetal Monitor Review: Baseline rate: 130.  Variability: moderate (6-25 bpm).   Pattern: accelerations present and no decelerations.   Fetal State Assessment: Category I - tracings are normal.   Physical Exam Vitals reviewed.  Constitutional:      Appearance: Normal appearance.   Cardiovascular:     Rate and Rhythm: Normal rate.  Pulmonary:     Effort: Pulmonary effort is normal.  Abdominal:     Tenderness: There is no abdominal tenderness.  Genitourinary:    General: Normal vulva.   Musculoskeletal:        General: Normal range of motion.     Cervical back:  Normal range of motion.   Skin:    General: Skin is warm and dry.   Neurological:     General: No focal deficit present.     Mental Status: She is alert and oriented to person, place, and time.   Psychiatric:        Mood and Affect: Mood normal.        Behavior: Behavior normal.     Dilation: 2.5 Effacement (%): 70 Station: 0 Exam by:: Brooke Sharp Blood pressure 114/79, pulse 73, temperature 98.1 F (36.7 C), temperature source Oral, resp. rate 16, height 5' 4 (1.626 m), weight 95.6 kg, last menstrual period 04/20/2022, SpO2 99%.  Prenatal labs: ABO, Rh:  --/--/PENDING (06/23 1121) Antibody: PENDING (06/23 1121) Rubella: Immune (06/23  0000) RPR: Nonreactive (06/23 0000)  HBsAg: Negative (06/23 0000)  HIV: Non-reactive (06/23 0000)  GBS: Negative/-- (06/23 0000)   ChemistryNo results for input(s): NA, K, CL, CO2, GLUCOSE, BUN, CREATININE, CALCIUM, GFRNONAA, GFRAA, ANIONGAP in the last 168 hours.  No results for input(s): PROT, ALBUMIN, AST, ALT, ALKPHOS, BILITOT in the last 168 hours. Hematology Recent Labs  Lab 07/02/23 1121  WBC 9.6  RBC 4.40  HGB 12.9  HCT 38.0  MCV 86.4  MCH 29.3  MCHC 33.9  RDW 13.1  PLT 180   Cardiac EnzymesNo results for input(s): TROPONINI in the last 168 hours. No results for input(s): TROPIPOC in the last 168 hours.  BNPNo results for input(s): BNP, PROBNP in the last 168 hours.  DDimer No results for input(s): DDIMER in the last 168 hours.   Assessment/Plan: Brooke Sharp is a 28 y.o. female [id number] [redacted]w[redacted]d admitted with SROM early labor.   -Admission to LD -Routine admission labs -SROM clear/early labor: discussed labor augmentation options, plan to start with buccal cytotec  and reassess as needed -Cont EFM/Toco -GBS NEG -May have IV pain meds and/or Epidural on request -PAI-1 hx s/p Lovenox  (off x1 week)  -IVF pregnancy -Routine intrapartum care -Anticipate SVD  Brooke Sharp A Brooke Sharp 07/02/2023, 12:45 PM

## 2023-07-02 NOTE — MAU Note (Signed)
 1058 IV attempted, flashed then lost.  Pt had warned and stated, typical, bad veins.

## 2023-07-02 NOTE — MAU Note (Signed)
 Brooke Sharp is a 28 y.o. at [redacted]w[redacted]d here in MAU reporting: she began leaking fluid @ 0600 this morning, reports fluid is clear and without odor.  Denies VB.  Endorses +FM.  LMP: 09/24/2022 Onset of complaint: today Pain score: 3 Vitals:   07/02/23 0828  BP: 116/76  Pulse: 98  Resp: 18  Temp: 98.4 F (36.9 C)  SpO2: 99%     FHT: 130 bpm  Lab orders placed from triage: NA

## 2023-07-02 NOTE — MAU Note (Signed)
 Pt notified of +ferning.  plan

## 2023-07-02 NOTE — Anesthesia Procedure Notes (Signed)
 Epidural Patient location during procedure: OB Start time: 07/02/2023 5:28 PM End time: 07/02/2023 5:31 PM  Staffing Anesthesiologist: Lucious Debby BRAVO, MD Performed: anesthesiologist   Preanesthetic Checklist Completed: patient identified, IV checked, risks and benefits discussed, monitors and equipment checked, pre-op evaluation and timeout performed  Epidural Patient position: sitting Prep: DuraPrep Patient monitoring: continuous pulse ox and blood pressure Approach: midline Location: L2-L3 Injection technique: LOR saline  Needle:  Needle type: Tuohy  Needle gauge: 17 G Needle length: 9 cm Needle insertion depth: 6 cm Catheter size: 19 Gauge Catheter at skin depth: 11 cm Test dose: negative and Other (1% lidocaine )  Assessment Events: blood not aspirated and no cerebrospinal fluid  Additional Notes Patient identified. Risks including, but not limited to, bleeding, infection, nerve damage, paralysis, inadequate analgesia, blood pressure changes, nausea, vomiting, allergic reaction, postpartum back pain, itching, and headache were discussed. Patient expressed understanding and wished to proceed. Sterile prep and drape, including hand hygiene, mask, and sterile gloves were used. The patient was positioned and the spine was prepped. The skin was anesthetized with lidocaine . No paraesthesia or other complication noted. The patient did not experience any signs of intravascular injection such as tinnitus or metallic taste in mouth, nor signs of intrathecal spread such as rapid motor block. Please see nursing notes for vital signs. The patient tolerated the procedure well.   Debby Lucious, MDReason for block:procedure for pain

## 2023-07-02 NOTE — Anesthesia Preprocedure Evaluation (Signed)
 Anesthesia Evaluation  Patient identified by MRN, date of birth, ID band Patient awake    Reviewed: Allergy  & Precautions, NPO status , Patient's Chart, lab work & pertinent test results  History of Anesthesia Complications Negative for: history of anesthetic complications  Airway Mallampati: II   Neck ROM: Full    Dental   Pulmonary neg pulmonary ROS   Pulmonary exam normal        Cardiovascular Normal cardiovascular exam   POTS    Neuro/Psych negative neurological ROS  negative psych ROS   GI/Hepatic Neg liver ROS,GERD  Medicated,,  Endo/Other   Obesity   Renal/GU negative Renal ROS     Musculoskeletal negative musculoskeletal ROS (+)    Abdominal   Peds  Hematology  Plt 180k PAI-1 deficiency - previously on lovenox  during pregnancy, last dose ~8 days ago    Anesthesia Other Findings   Reproductive/Obstetrics (+) Pregnancy                             Anesthesia Physical Anesthesia Plan  ASA: 2  Anesthesia Plan: Epidural   Post-op Pain Management: Minimal or no pain anticipated   Induction:   PONV Risk Score and Plan: 2 and Treatment may vary due to age or medical condition  Airway Management Planned: Natural Airway  Additional Equipment: None  Intra-op Plan:   Post-operative Plan:   Informed Consent: I have reviewed the patients History and Physical, chart, labs and discussed the procedure including the risks, benefits and alternatives for the proposed anesthesia with the patient or authorized representative who has indicated his/her understanding and acceptance.       Plan Discussed with: Anesthesiologist  Anesthesia Plan Comments: (Labs reviewed. Platelets acceptable, patient not taking any blood thinning medications. Per RN, FHR tracing reported to be stable enough for sitting procedure. Risks and benefits discussed with patient, including PDPH, backache,  epidural hematoma, failed epidural, blood pressure changes, allergic reaction, and nerve injury. Patient expressed understanding and wished to proceed.)        Anesthesia Quick Evaluation

## 2023-07-03 ENCOUNTER — Encounter (HOSPITAL_COMMUNITY): Payer: Self-pay | Admitting: Obstetrics and Gynecology

## 2023-07-03 DIAGNOSIS — S3141XA Laceration without foreign body of vagina and vulva, initial encounter: Secondary | ICD-10-CM | POA: Diagnosis not present

## 2023-07-03 DIAGNOSIS — Z1589 Genetic susceptibility to other disease: Secondary | ICD-10-CM

## 2023-07-03 LAB — CBC
HCT: 32.5 % — ABNORMAL LOW (ref 36.0–46.0)
Hemoglobin: 10.9 g/dL — ABNORMAL LOW (ref 12.0–15.0)
MCH: 29.4 pg (ref 26.0–34.0)
MCHC: 33.5 g/dL (ref 30.0–36.0)
MCV: 87.6 fL (ref 80.0–100.0)
Platelets: 155 10*3/uL (ref 150–400)
RBC: 3.71 MIL/uL — ABNORMAL LOW (ref 3.87–5.11)
RDW: 13.3 % (ref 11.5–15.5)
WBC: 15.7 10*3/uL — ABNORMAL HIGH (ref 4.0–10.5)
nRBC: 0 % (ref 0.0–0.2)

## 2023-07-03 LAB — RPR: RPR Ser Ql: NONREACTIVE

## 2023-07-03 MED ORDER — COCONUT OIL OIL: 1.0000 | TOPICAL_OIL | Status: DC | PRN

## 2023-07-03 MED ORDER — DIPHENHYDRAMINE HCL 25 MG PO CAPS: 25.0000 mg | ORAL_CAPSULE | Freq: Four times a day (QID) | ORAL | Status: DC | PRN

## 2023-07-03 MED ORDER — BENZOCAINE-MENTHOL 20-0.5 % EX AERO
1.0000 | INHALATION_SPRAY | CUTANEOUS | Status: DC | PRN
  Administered 2023-07-03: 1 via TOPICAL
  Filled 2023-07-03: qty 56

## 2023-07-03 MED ORDER — ACETAMINOPHEN 325 MG PO TABS
650.0000 mg | ORAL_TABLET | ORAL | Status: DC | PRN
  Administered 2023-07-03: 650 mg via ORAL
  Filled 2023-07-03: qty 2

## 2023-07-03 MED ORDER — DIBUCAINE (PERIANAL) 1 % EX OINT: 1.0000 | TOPICAL_OINTMENT | CUTANEOUS | Status: DC | PRN

## 2023-07-03 MED ORDER — SENNOSIDES-DOCUSATE SODIUM 8.6-50 MG PO TABS
2.0000 | ORAL_TABLET | Freq: Every day | ORAL | Status: DC
  Administered 2023-07-04: 2 via ORAL
  Filled 2023-07-03: qty 2

## 2023-07-03 MED ORDER — ONDANSETRON HCL 4 MG PO TABS: 4.0000 mg | ORAL_TABLET | ORAL | Status: DC | PRN

## 2023-07-03 MED ORDER — TETANUS-DIPHTH-ACELL PERTUSSIS 5-2.5-18.5 LF-MCG/0.5 IM SUSY
0.5000 mL | PREFILLED_SYRINGE | Freq: Once | INTRAMUSCULAR | Status: DC
  Filled 2023-07-03: qty 0.5

## 2023-07-03 MED ORDER — PRENATAL MULTIVITAMIN CH
1.0000 | ORAL_TABLET | Freq: Every day | ORAL | Status: DC
Start: 2023-07-03 — End: 2023-07-04
  Administered 2023-07-03 – 2023-07-04 (×2): 1 via ORAL
  Filled 2023-07-03 (×2): qty 1

## 2023-07-03 MED ORDER — PANTOPRAZOLE SODIUM 40 MG PO TBEC
40.0000 mg | DELAYED_RELEASE_TABLET | Freq: Every day | ORAL | Status: DC
  Administered 2023-07-03 – 2023-07-04 (×2): 40 mg via ORAL
  Filled 2023-07-03 (×2): qty 1

## 2023-07-03 MED ORDER — SIMETHICONE 80 MG PO CHEW: 80.0000 mg | CHEWABLE_TABLET | ORAL | Status: DC | PRN

## 2023-07-03 MED ORDER — IBUPROFEN 600 MG PO TABS
600.0000 mg | ORAL_TABLET | Freq: Four times a day (QID) | ORAL | Status: DC
  Administered 2023-07-03 – 2023-07-04 (×6): 600 mg via ORAL
  Filled 2023-07-03 (×6): qty 1

## 2023-07-03 MED ORDER — ZOLPIDEM TARTRATE 5 MG PO TABS: 5.0000 mg | ORAL_TABLET | Freq: Every evening | ORAL | Status: DC | PRN

## 2023-07-03 MED ORDER — WITCH HAZEL-GLYCERIN EX PADS: 1.0000 | MEDICATED_PAD | CUTANEOUS | Status: DC | PRN

## 2023-07-03 MED ORDER — ONDANSETRON HCL 4 MG/2ML IJ SOLN: 4.0000 mg | INTRAMUSCULAR | Status: DC | PRN

## 2023-07-03 NOTE — Progress Notes (Signed)
 Chart was flagging that patient still had foley catheter in from L&D. Patient did not have foley catheter upon arrival to [facility] per previous nightshift RN. This RN documented that the foley catheter is removed, but this RN did not remove a foley catheter. Catheter was removed by L&D nurses on previous shift.

## 2023-07-03 NOTE — Lactation Note (Signed)
 This note was copied from a baby's chart. 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 LC reassured MOB that this is normal behavior. LC reviewed the first 24 hr birthday nap and what it may look like. LC also reviewed day 2 cluster feeding with MOB. Infant had just fed, so LC was unable to assess a latch. MOB denies any pain or pinching when infant nurses. MOB was also concerned that she was unable to hand express herself, but others could. LC offered to talk MOB through hand expressing herself. MOB agreed. LC talked MOB through hand expressing the right breast. MOB was able to express 2 small drops of colostrum. MOB thanked LC. MOB denied having further questions or concerns.  LC reviewed feeding infant on cue 8-12x in 24 hrs, not allowing infant to go over 3 hrs without a feeding, CDC milk storage guidelines, LC services handout and engorgement/breast care. LC encouraged MOB to call for further assistance as needed.  Maternal Data Has patient been taught Hand Expression?: Yes Does the patient have breastfeeding experience prior to this delivery?: No  Feeding Mother's Current Feeding Choice: Breast Milk  Lactation Tools Discussed/Used Pump Education: Milk Storage  Interventions Interventions: Breast feeding basics reviewed;Hand express;Education;LC Services brochure  Discharge Discharge Education: Engorgement and breast care;Warning signs for feeding baby Pump: Hands Free;Personal;Referral sent for [facility] Pump  Consult Status Consult Status: Follow-up Date: 07/04/23 Follow-up type: In-patient    Recardo Hoit BS, IBCLC 07/03/2023, 4:42 PM

## 2023-07-03 NOTE — Anesthesia Postprocedure Evaluation (Signed)
 Anesthesia Post Note  Patient: Brooke Sharp  Procedure(s) Performed: AN AD HOC LABOR EPIDURAL     Patient location during evaluation: Mother Baby Anesthesia Type: Epidural Level of consciousness: awake, oriented and awake and alert Pain management: pain level controlled Vital Signs Assessment: post-procedure vital signs reviewed and stable Respiratory status: spontaneous breathing Cardiovascular status: stable Postop Assessment: no headache, no backache, no apparent nausea or vomiting, adequate PO intake and able to ambulate Anesthetic complications: no   No notable events documented.  Last Vitals:  Vitals:   07/03/23 1530 07/03/23 1921  BP: 107/69 118/71  Pulse:  82  Resp: 16 17  Temp: 36.7 C 36.8 C  SpO2: 100% 100%    Last Pain:  Vitals:   07/03/23 1930  TempSrc:   PainSc: 5    Pain Goal:                Epidural/Spinal Function Cutaneous sensation: Normal sensation (07/03/23 1930)  Olam KATHEE Ina

## 2023-07-04 ENCOUNTER — Other Ambulatory Visit (HOSPITAL_BASED_OUTPATIENT_CLINIC_OR_DEPARTMENT_OTHER): Payer: Self-pay

## 2023-07-04 LAB — BIRTH TISSUE RECOVERY COLLECTION (PLACENTA DONATION)

## 2023-07-04 MED ORDER — IBUPROFEN 600 MG PO TABS
600.0000 mg | ORAL_TABLET | Freq: Four times a day (QID) | ORAL | 0 refills | Status: AC
  Filled 2023-07-04: qty 30, 8d supply, fill #0

## 2023-07-04 NOTE — Discharge Instructions (Signed)

## 2023-07-04 NOTE — Discharge Summary (Signed)
 Postpartum Discharge Summary     Patient Name: Brooke Sharp DOB: 1995/04/19 MRN: [id number]  Date of admission: 07/02/2023 Delivery date:07/03/2023 Delivering provider: JOSHUA PALMA K Date of discharge: 07/04/2023  Admitting diagnosis: ROM (rupture of membranes), premature [O42.90]   Intrauterine pregnancy: [redacted]w[redacted]d     Secondary diagnosis: IVF pregnancy PAI -1 deficiency on Anticoagulation   Additional problems: Dizziness, possible POTS, seeing cardiologist Dr Sheena in pregnancy                                      GERD     Discharge diagnosis: Term Pregnancy Delivered                                              Post partum procedures:none Augmentation: Pitocin and Cytotec  Complications: None  Hospital course: Induction of Labor With Vaginal Delivery   28 y.o. yo [phone] at [redacted]w[redacted]d was admitted to the hospital 07/02/2023 for induction of labor.  Indication for induction: SROM.  Patient had an labor course complicated by none Membrane Rupture Time/Date: 6:00 AM,07/02/2023  Delivery Method:Vaginal, Spontaneous Operative Delivery:N/A Episiotomy: None Lacerations:  Vaginal Details of delivery can be found in separate delivery note.  Patient had a postpartum course complicated by none. Patient is discharged home 07/04/23.  Newborn Data: Birth date:07/03/2023 Birth time:3:41 AM Gender:Female Living status:Living Apgars:9 ,9  Weight:3030 g  Magnesium Sulfate received: No BMZ received: No Rhophylac:N/A MMR:N/A T-DaP:Given prenatally Flu: No RSV Vaccine received: No Transfusion:No Immunizations administered: There is no immunization history for the selected administration types on file for this patient.  Physical exam  Vitals:   07/03/23 1200 07/03/23 1530 07/03/23 1921 07/04/23 0430  BP: 105/60 107/69 118/71 104/75  Pulse: 79  82 65  Resp: 18 16 17 17   Temp: 98.8 F (37.1 C) 98.1 F (36.7 C) 98.2 F (36.8 C) 98.2 F (36.8 C)  TempSrc: Oral Oral Oral  Oral  SpO2: 98% 100% 100% 100%  Weight:      Height:       General: alert, cooperative, and no distress Lochia: appropriate Uterine Fundus: firm Incision: Healing well with no significant drainage, No significant erythema DVT Evaluation: No evidence of DVT seen on physical exam. Negative Homan's sign. Labs: Lab Results  Component Value Date   WBC 15.7 (H) 07/03/2023   HGB 10.9 (L) 07/03/2023   HCT 32.5 (L) 07/03/2023   MCV 87.6 07/03/2023   PLT 155 07/03/2023      Latest Ref Rng & Units 05/05/2023   11:11 PM  CMP  Glucose 70 - 99 mg/dL 96   BUN 6 - 20 mg/dL 7   Creatinine 9.55 - 8.99 mg/dL 9.49   Sodium 864 - 854 mmol/L 135   Potassium 3.5 - 5.1 mmol/L 3.7   Chloride 98 - 111 mmol/L 104   CO2 22 - 32 mmol/L 23   Calcium 8.9 - 10.3 mg/dL 8.9   Total Protein 6.5 - 8.1 g/dL 6.5   Total Bilirubin 0.0 - 1.2 mg/dL 0.4   Alkaline Phos 38 - 126 U/L 88   AST 15 - 41 U/L 24   ALT 0 - 44 U/L 18    Edinburgh Score:    07/04/2023    4:30 AM  Edinburgh Postnatal Depression Scale Screening  Tool  I have been able to laugh and see the funny side of things. 0  I have looked forward with enjoyment to things. 0  I have blamed myself unnecessarily when things went wrong. 1  I have been anxious or worried for no good reason. 0  I have felt scared or panicky for no good reason. 0  Things have been getting on top of me. 3  I have been so unhappy that I have had difficulty sleeping. 0  I have felt sad or miserable. 0  I have been so unhappy that I have been crying. 0  The thought of harming myself has occurred to me. 0  Edinburgh Postnatal Depression Scale Total 4      After visit meds:  Allergies as of 07/04/2023       Reactions   Heparin  Hives   Shellfish Allergy  Anaphylaxis   Latex Hives   Meloxicam Swelling        Medication List     STOP taking these medications    aspirin EC 81 MG tablet       TAKE these medications    ibuprofen 600 MG tablet Commonly  known as: ADVIL Take 1 tablet (600 mg total) by mouth every 6 (six) hours.   multivitamin-prenatal 27-0.8 MG Tabs tablet Take 1 tablet by mouth daily at 12 noon. With dhea   pantoprazole  40 MG tablet Commonly known as: PROTONIX  Take 1 tablet (40 mg total) by mouth daily.   propranolol  10 MG tablet Commonly known as: INDERAL  Take 1 tablet (10 mg total) by mouth every 12 (twelve) hours as needed (for a heart rate over 150 for 5 minutes, drink at least 8 oz of water with this tablet).         Discharge home in stable condition Infant Feeding: Breast Infant Disposition:home with mother Discharge instruction: per After Visit Summary and Postpartum booklet. Activity: Advance as tolerated. Pelvic rest for 6 weeks.  Diet: routine diet Anticipated Birth Control: Unsure Postpartum Appointment:6 weeks Additional Postpartum F/U: sooner as needed  Future Appointments: Future Appointments  Date Time Provider Department Center  10/02/2023 10:20 AM Tobb, Kardie, DO [facility]   Follow up Visit:  Follow-up Information     Barbette Knock, MD Follow up in 6 week(s).   Specialty: Obstetrics and Gynecology Contact information: [address] [place] [address] [phone]                     07/04/2023 Knock JONELLE Barbette, MD

## 2023-07-04 NOTE — Lactation Note (Signed)
 This note was copied from a baby's chart. Lactation Consultation Note  Patient Name: Brooke Sharp Unijb'd Date: 07/04/2023 Age:28 hours Reason for consult: Follow-up assessment;RN request;Early term 37-38.6wks;Primapara RN asked LC to see mom that baby wasn't interested in BF much. Mom stated baby hadn't fed since 2200 and it was a good feeding but hasn't been interested since. Baby doesn't open mouth very wide at all. Got baby to open up and latch, suckled a few times that was it. Even when baby cried he hardly opened his mouth. LC changed void diaper. Mom stated baby has good out put. Mom stated baby swallowed a lot of amniotic fluid. Explained to mom that is why he isn't hungry, his belly is full. Mom stated understanding. Encouraged to feed q 3 hr and w/cues. Praised mom for good attempts. Encouraged to call for assistance or questions.  Maternal Data Does the patient have breastfeeding experience prior to this delivery?: No  Feeding    LATCH Score Latch: Too sleepy or reluctant, no latch achieved, no sucking elicited.  Audible Swallowing: None  Type of Nipple: Everted at rest and after stimulation  Comfort (Breast/Nipple): Soft / non-tender  Hold (Positioning): Assistance needed to correctly position infant at breast and maintain latch.  LATCH Score: 5   Lactation Tools Discussed/Used    Interventions Interventions: Breast feeding basics reviewed;Assisted with latch;Skin to skin;Breast compression;Adjust position;Support pillows;Position options;Education  Discharge    Consult Status Consult Status: Follow-up Date: 07/04/23 Follow-up type: In-patient    [name] 07/04/2023, 2:39 AM

## 2023-07-04 NOTE — Progress Notes (Signed)
 Post Partum Day 1 SVD Boy. 37 wks SROM. Breast feeding. Circumcision today  IVF, PAI1 on anticoag, stopped at 36 wks.   Subjective: no complaints, up ad lib, voiding, tolerating PO, and + flatus  bleeding is minimal per pt. Feels great and wants early discharge   Objective: Blood pressure 104/75, pulse 65, temperature 98.2 F (36.8 C), temperature source Oral, resp. rate 17, height 5' 4 (1.626 m), weight 95.6 kg, last menstrual period 04/20/2022, SpO2 100%, unknown if currently breastfeeding.  Physical Exam:  General: alert, cooperative, and appears stated age Lochia: appropriate Uterine Fundus: firm Incision: healing well, no significant drainage DVT Evaluation: No evidence of DVT seen on physical exam. Negative Homan's sign.     Latest Ref Rng & Units 07/03/2023    6:37 AM 07/02/2023   11:21 AM 05/05/2023   11:11 PM  CBC  WBC 4.0 - 10.5 K/uL 15.7  9.6  12.4   Hemoglobin 12.0 - 15.0 g/dL 89.0  87.0  88.3   Hematocrit 36.0 - 46.0 % 32.5  38.0  34.0   Platelets 150 - 400 K/uL 155  180  179      Assessment/Plan: PPD #1, SVD doing well  Discharge home w/ instructions, call parameters F/up office 6 weeks Stop bbASA . Contin PNvit     LOS: 2 days   Robbi JONELLE Render, MD 07/04/2023, 8:58 AM

## 2023-07-04 NOTE — Plan of Care (Signed)
  Problem: Education: Goal: Knowledge of General Education information will improve Description: Including pain rating scale, medication(s)/side effects and non-pharmacologic comfort measures Outcome: Adequate for Discharge   Problem: Health Behavior/Discharge Planning: Goal: Ability to manage health-related needs will improve Outcome: Adequate for Discharge   Problem: Clinical Measurements: Goal: Ability to maintain clinical measurements within normal limits will improve Outcome: Adequate for Discharge Goal: Will remain free from infection Outcome: Adequate for Discharge Goal: Diagnostic test results will improve Outcome: Adequate for Discharge Goal: Respiratory complications will improve Outcome: Adequate for Discharge Goal: Cardiovascular complication will be avoided Outcome: Adequate for Discharge   Problem: Activity: Goal: Risk for activity intolerance will decrease Outcome: Adequate for Discharge   Problem: Nutrition: Goal: Adequate nutrition will be maintained Outcome: Adequate for Discharge   Problem: Coping: Goal: Level of anxiety will decrease Outcome: Adequate for Discharge   Problem: Elimination: Goal: Will not experience complications related to bowel motility Outcome: Adequate for Discharge Goal: Will not experience complications related to urinary retention Outcome: Adequate for Discharge   Problem: Pain Managment: Goal: General experience of comfort will improve and/or be controlled Outcome: Adequate for Discharge   Problem: Safety: Goal: Ability to remain free from injury will improve Outcome: Adequate for Discharge   Problem: Skin Integrity: Goal: Risk for impaired skin integrity will decrease Outcome: Adequate for Discharge   Problem: Education: Goal: Knowledge of condition will improve Outcome: Adequate for Discharge Goal: Individualized Educational Video(s) Outcome: Adequate for Discharge Goal: Individualized Newborn Educational  Video(s) Outcome: Adequate for Discharge   Problem: Activity: Goal: Will verbalize the importance of balancing activity with adequate rest periods Outcome: Adequate for Discharge Goal: Ability to tolerate increased activity will improve Outcome: Adequate for Discharge   Problem: Coping: Goal: Ability to identify and utilize available resources and services will improve Outcome: Adequate for Discharge   Problem: Life Cycle: Goal: Chance of risk for complications during the postpartum period will decrease Outcome: Adequate for Discharge   Problem: Role Relationship: Goal: Ability to demonstrate positive interaction with newborn will improve Outcome: Adequate for Discharge   Problem: Skin Integrity: Goal: Demonstration of wound healing without infection will improve Outcome: Adequate for Discharge

## 2023-07-04 NOTE — Lactation Note (Signed)
 This note was copied from a baby's chart. Lactation Consultation Note  Patient Name: Brooke Sharp Unijb'd Date: 07/04/2023 Age:28 hours Reason for consult: Follow-up assessment;1st time breastfeeding;Early term 37-38.6wks  P1, Baby [redacted]w[redacted]d.  5% weight loss.  Baby was recently circumcised.  Attempted latching but baby was sleepy after recent 10 min feeding. Discussed waking baby for feeding and post pumping and giving volume back if baby is sleepy at the breast and having short feedings. Reviewed engorgement care and monitoring voids/stools.   Maternal Data Does the patient have breastfeeding experience prior to this delivery?: No  Feeding Mother's Current Feeding Choice: Breast Milk   Lactation Tools Discussed/Used  DEBP  Interventions Interventions: Breast feeding basics reviewed;Education;DEBP  Discharge Discharge Education: Engorgement and breast care;Warning signs for feeding baby Pump: Received Stork Pump (Spectra )  Consult Status Consult Status: Complete Date: 07/04/23    Shannon Dines [facility] 07/04/2023, 1:00 PM

## 2023-07-04 NOTE — Social Work (Signed)
 MOB was referred for history of depression/anxiety.  * Referral screened out by Clinical Social Worker because none of the following criteria appear to apply:  ~ History of anxiety/depression during this pregnancy, or of post-partum depression following prior delivery.  ~ Diagnosis of anxiety and/or depression within last 3 years OR * MOB's symptoms currently being treated with medication and/or therapy.  Per chart review MOB's diagnosis dates back to Feb 2022, per OB records no MH concerns noted during this pregnancy [place] =4  Please contact the Clinical Social Worker if needs arise, or by MOB request.  Eliazar Gave, LCSWA Clinical Social Worker [phone]

## 2023-07-12 ENCOUNTER — Telehealth (HOSPITAL_COMMUNITY): Payer: Self-pay | Admitting: *Deleted

## 2023-07-12 NOTE — Telephone Encounter (Signed)
 07/12/2023  Name: Zuleika Gallus MRN: [id number] DOB: [birth year]  Reason for Call:  Transition of Care Hospital Discharge Call  Contact Status: Patient Contact Status: Message  Language assistant needed:          Follow-Up Questions:    Van Postnatal Depression Scale:  In the Past 7 Days:    PHQ2-9 Depression Scale:     Discharge Follow-up:    Post-discharge interventions: NA  Mliss Sieve, RN 07/12/2023 15:00

## 2023-07-14 ENCOUNTER — Inpatient Hospital Stay (HOSPITAL_COMMUNITY)

## 2023-07-14 ENCOUNTER — Inpatient Hospital Stay (HOSPITAL_COMMUNITY): Admission: RE | Admit: 2023-07-14 | Source: Home / Self Care | Admitting: Obstetrics & Gynecology

## 2023-08-20 ENCOUNTER — Other Ambulatory Visit (HOSPITAL_BASED_OUTPATIENT_CLINIC_OR_DEPARTMENT_OTHER): Payer: Self-pay

## 2023-08-20 DIAGNOSIS — Z1331 Encounter for screening for depression: Secondary | ICD-10-CM | POA: Diagnosis not present

## 2023-08-20 MED ORDER — SERTRALINE HCL 25 MG PO TABS
25.0000 mg | ORAL_TABLET | Freq: Every day | ORAL | 3 refills | Status: AC
  Filled 2023-08-20: qty 90, 90d supply, fill #0
  Filled 2023-12-27: qty 90, 90d supply, fill #1

## 2023-10-01 ENCOUNTER — Encounter: Payer: Self-pay | Admitting: *Deleted

## 2023-10-02 ENCOUNTER — Ambulatory Visit: Admitting: Cardiology

## 2023-10-03 ENCOUNTER — Encounter: Payer: Self-pay | Admitting: *Deleted

## 2023-10-05 ENCOUNTER — Telehealth: Payer: Self-pay

## 2023-10-05 ENCOUNTER — Ambulatory Visit: Attending: Cardiology | Admitting: Cardiology

## 2023-10-05 ENCOUNTER — Encounter: Payer: Self-pay | Admitting: Cardiology

## 2023-10-05 DIAGNOSIS — R42 Dizziness and giddiness: Secondary | ICD-10-CM | POA: Diagnosis not present

## 2023-10-05 NOTE — Progress Notes (Signed)
 Cardio-Obstetrics Clinic  Follow Up Note   Date:  10/08/2023   ID:  Brooke Sharp, DOB 1995-09-23, MRN [id number]  PCP:  Freddrick Johns   [facility] Providers Cardiologist:  Dub Huntsman, DO  Electrophysiologist:  None        Referring MD: No ref. provider found   Chief Complaint:  I am still getting dizzy but no as much  She is at home. I am in the clinic.  Virtual Visit via Video Visit  Note . I connected with the patient today by a telephone enabled telemedicine application and verified that I am speaking with the correct person using two identifiers.   History of Present Illness:    Brooke Sharp is a 28 y.o. female [G4P1031] who returns for follow up postpartum visit. She presents with postpartum dizziness.  She reports that the dizziness has been present since delivery, with reduced frequency and severity compared to during pregnancy. Episodes occur randomly and are shorter in duration, no longer predominantly associated with standing. She has not used the prescribed medication postpartum as symptoms do not persist long enough. Fluid intake is approximately eighty ounces of water daily, with occasional soda and a morning cup of coffee. She sleeps about seven hours per night. No recent palpitations, which were previously associated with dizziness during pregnancy.    Prior CV Studies Reviewed: The following studies were reviewed today: Zio monitor   Past Medical History:  Diagnosis Date   Adjustment disorder with mixed anxiety and depressed mood 02/25/2020   COVID 09/2018   weakness, sob muscle cramps x 2 weeks all symptoms resolved   History of palpitations in adulthood    summer 2021, wore heart monitor from pcp dr [facility], issue resolved   Wears glasses 06/24/2020    Past Surgical History:  Procedure Laterality Date   Egg Retrieval     left shoulder surgery Left 2017   labral tear   WISDOM  TOOTH EXTRACTION  2017      OB History     Gravida  4   Para  1   Term  1   Preterm      AB  3   Living  1      SAB  3   IAB      Ectopic      Multiple  0   Live Births  1               Current Medications: Current Meds  Medication Sig   Acetaminophen  (TYLENOL  PO) Take 650 mg by mouth as needed.   Prenatal Vit-Fe Fumarate-FA (MULTIVITAMIN-PRENATAL) 27-0.8 MG TABS tablet Take 1 tablet by mouth daily at 12 noon. With dhea   propranolol  (INDERAL ) 10 MG tablet Take 1 tablet (10 mg total) by mouth every 12 (twelve) hours as needed (for a heart rate over 150 for 5 minutes, drink at least 8 oz of water with this tablet).   sertraline  (ZOLOFT ) 25 MG tablet Take 1 tablet (25 mg total) by mouth daily.     Allergies:   Heparin , Shellfish allergy , Latex, and Meloxicam   Social History   Socioeconomic History   Marital status: Married    Spouse name: Not on file   Number of children: Not on file   Years of education: Not on file   Highest education level: Not on file  Occupational History   Not on file  Tobacco Use   Smoking status: Never  Smokeless tobacco: Never  Vaping Use   Vaping status: Never Used  Substance and Sexual Activity   Alcohol use: Not Currently    Comment: occ   Drug use: Not Currently    Comment: experimental use of marijuana 7 years ago   Sexual activity: Yes    Birth control/protection: None  Other Topics Concern   Not on file  Social History Narrative   Not on file   Social Drivers of Health   Financial Resource Strain: Not on file  Food Insecurity: No Food Insecurity (07/03/2023)   Hunger Vital Sign    Worried About Running Out of Food in the Last Year: Never true    Ran Out of Food in the Last Year: Never true  Transportation Needs: No Transportation Needs (07/03/2023)   PRAPARE - Administrator, Civil Service (Medical): No    Lack of Transportation (Non-Medical): No  Physical Activity: Not on file  Stress: Not  on file  Social Connections: Not on file      Family History  Problem Relation Age of Onset   Depression Mother    Deep vein thrombosis Mother    Healthy Father    Depression Paternal Grandmother    Drug abuse Paternal Grandmother       ROS:   Please see the history of present illness.     All other systems reviewed and are negative.   Labs/EKG Reviewed:    EKG:  not performed   Recent Labs: 05/05/2023: ALT 18; BUN 7; Creatinine, Ser 0.50; Potassium 3.7; Sodium 135 07/03/2023: Hemoglobin 10.9; Platelets 155   Recent Lipid Panel No results found for: CHOL, TRIG, HDL, CHOLHDL, LDLCALC, LDLDIRECT  Physical Exam:    VS:  Ht 5' 4 (1.626 m)   Wt 191 lb (86.6 kg)   BMI 32.79 kg/m     Wt Readings from Last 3 Encounters:  10/05/23 191 lb (86.6 kg)  07/02/23 210 lb 11.2 oz (95.6 kg)  06/01/23 205 lb (93 kg)     Risk Assessment/Risk Calculators:        Modified World Health Organization (WHO) Classification of Maternal CV Risk   Class I        ASSESSMENT & PLAN:    Postpartum dizziness - Intermittent dizziness likely due to inadequate fluid and carbohydrate intake, expected to resolve with dietary adjustments. - Increase fluid intake to 120 ounces daily. - Increase carbohydrate intake to support breastfeeding. - Follow-up in 4 to 6 months. - Consider plain treadmill test if dizziness persists.   Patient Instructions  Medication Instructions:  Your physician recommends that you continue on your current medications as directed. Please refer to the Current Medication list given to you today.  *If you need a refill on your cardiac medications before your next appointment, please call your pharmacy*    Follow-Up: At [facility], you and your health needs are our priority.  As part of our continuing mission to provide you with exceptional heart care, our providers are all part of one team.  This team includes your primary Cardiologist  (physician) and Advanced Practice Providers or APPs (Physician Assistants and Nurse Practitioners) who all work together to provide you with the care you need, when you need it.  Your next appointment:   4-6 week(s)  Provider:   Tyrique Sporn, DO            Dispo:  No follow-ups on file.   Medication Adjustments/Labs and Tests Ordered: Current medicines are  reviewed at length with the patient today.  Concerns regarding medicines are outlined above.  Tests Ordered: No orders of the defined types were placed in this encounter.  Medication Changes: No orders of the defined types were placed in this encounter.

## 2023-10-05 NOTE — Telephone Encounter (Signed)
 Called pt to schedule a 4-6 week appointment with Dr. Sheena. No answer, left a message for her to return the call. Plan to schedule pt on 10/29th (doublebook)

## 2023-10-05 NOTE — Patient Instructions (Addendum)
 Medication Instructions:  Your physician recommends that you continue on your current medications as directed. Please refer to the Current Medication list given to you today.  *If you need a refill on your cardiac medications before your next appointment, please call your pharmacy*    Follow-Up: At [facility], you and your health needs are our priority.  As part of our continuing mission to provide you with exceptional heart care, our providers are all part of one team.  This team includes your primary Cardiologist (physician) and Advanced Practice Providers or APPs (Physician Assistants and Nurse Practitioners) who all work together to provide you with the care you need, when you need it.  Your next appointment:   4-6 week(s)  Provider:   Kardie Tobb, DO

## 2023-10-30 ENCOUNTER — Ambulatory Visit: Payer: Self-pay

## 2023-10-30 NOTE — Telephone Encounter (Signed)
 FYI Only or Action Required?: FYI only for provider.  Patient was last seen in primary care on 05/25/2023 by Donnamae Sheffield BRAVO, NP.  Called Nurse Triage reporting Fatigue.  Symptoms began several months ago.   Symptoms are: unchanged.  Triage Disposition: See PCP Within 2 Weeks  Patient/caregiver understands and will follow disposition?: Yes       Copied from CRM [id number]. Topic: Clinical - Red Word Triage >> Oct 30, 2023 12:46 PM Hadassah PARAS wrote: Red Word that prompted transfer to Nurse Triage: Increased fatigue, dizzyness         Reason for Disposition  Fatigue is a chronic symptom (recurrent or ongoing AND present > 4 weeks)  Answer Assessment - Initial Assessment Questions Patient states she is at work and will call back to schedule a new patient appointment.      1. DESCRIPTION: Describe how you are feeling.     Increased fatigue  2. SEVERITY: How bad is it?  Can you stand and walk?     I feel just as tired when I wake up as I do when I fall asleep  3. ONSET: When did these symptoms begin? (e.g., hours, days, weeks, months)     Several months since giving birth  4. CAUSE: What do you think is causing the weakness or fatigue? (e.g., not drinking enough fluids, medical problem, trouble sleeping)     Unsure  5. NEW MEDICINES:  Have you started on any new medicines recently? (e.g., opioid pain medicines, benzodiazepines, muscle relaxants, antidepressants, antihistamines, neuroleptics, beta blockers)     No 6. OTHER SYMPTOMS: Do you have any other symptoms? (e.g., chest pain, fever, cough, SOB, vomiting, diarrhea, bleeding, other areas of pain)     Dizziness but is being evaluated by her cardiologist 7. PREGNANCY: Is there any chance you are pregnant? When was your last menstrual period?     No  Protocols used: Weakness (Generalized) and Fatigue-A-AH

## 2024-01-17 ENCOUNTER — Encounter: Payer: Self-pay | Admitting: Cardiology
